# Patient Record
Sex: Male | Born: 1965
Health system: Southern US, Community
[De-identification: ages and names within clinical notes are randomized; demographics above are authoritative.]

## PROBLEM LIST (undated history)

## (undated) DIAGNOSIS — G600 Hereditary motor and sensory neuropathy: Secondary | ICD-10-CM

## (undated) DIAGNOSIS — J302 Other seasonal allergic rhinitis: Secondary | ICD-10-CM

## (undated) DIAGNOSIS — G4700 Insomnia, unspecified: Secondary | ICD-10-CM

## (undated) DIAGNOSIS — N529 Male erectile dysfunction, unspecified: Secondary | ICD-10-CM

## (undated) DIAGNOSIS — I10 Essential (primary) hypertension: Secondary | ICD-10-CM

## (undated) HISTORY — DX: Hereditary motor and sensory neuropathy: G60.0

## (undated) HISTORY — DX: Insomnia, unspecified: G47.00

## (undated) HISTORY — DX: Other seasonal allergic rhinitis: J30.2

## (undated) HISTORY — DX: Male erectile dysfunction, unspecified: N52.9

## (undated) HISTORY — PX: NO PAST SURGERIES: SHX2092

## (undated) HISTORY — DX: Essential (primary) hypertension: I10

---

## 1999-04-24 ENCOUNTER — Encounter: Admission: RE | Admit: 1999-04-24 | Discharge: 1999-04-24 | Payer: Self-pay | Admitting: Family Medicine

## 1999-06-08 ENCOUNTER — Encounter: Admission: RE | Admit: 1999-06-08 | Discharge: 1999-06-08 | Payer: Self-pay | Admitting: Family Medicine

## 1999-06-15 ENCOUNTER — Encounter: Admission: RE | Admit: 1999-06-15 | Discharge: 1999-06-15 | Payer: Self-pay | Admitting: Sports Medicine

## 1999-07-08 ENCOUNTER — Encounter: Admission: RE | Admit: 1999-07-08 | Discharge: 1999-07-08 | Payer: Self-pay | Admitting: Family Medicine

## 2000-07-14 ENCOUNTER — Encounter: Admission: RE | Admit: 2000-07-14 | Discharge: 2000-07-14 | Payer: Self-pay | Admitting: Family Medicine

## 2001-04-24 ENCOUNTER — Encounter: Admission: RE | Admit: 2001-04-24 | Discharge: 2001-04-24 | Payer: Self-pay | Admitting: Family Medicine

## 2001-11-15 ENCOUNTER — Encounter: Admission: RE | Admit: 2001-11-15 | Discharge: 2001-11-15 | Payer: Self-pay | Admitting: Sports Medicine

## 2002-05-16 ENCOUNTER — Encounter: Admission: RE | Admit: 2002-05-16 | Discharge: 2002-05-16 | Payer: Self-pay | Admitting: Family Medicine

## 2002-05-30 ENCOUNTER — Encounter: Admission: RE | Admit: 2002-05-30 | Discharge: 2002-05-30 | Payer: Self-pay | Admitting: Family Medicine

## 2002-10-24 ENCOUNTER — Encounter: Admission: RE | Admit: 2002-10-24 | Discharge: 2002-10-24 | Payer: Self-pay | Admitting: Family Medicine

## 2002-11-09 ENCOUNTER — Encounter: Admission: RE | Admit: 2002-11-09 | Discharge: 2002-11-09 | Payer: Self-pay | Admitting: Sports Medicine

## 2002-11-20 ENCOUNTER — Encounter: Admission: RE | Admit: 2002-11-20 | Discharge: 2002-12-04 | Payer: Self-pay

## 2003-02-07 ENCOUNTER — Encounter: Admission: RE | Admit: 2003-02-07 | Discharge: 2003-02-07 | Payer: Self-pay | Admitting: Family Medicine

## 2003-02-08 ENCOUNTER — Encounter: Admission: RE | Admit: 2003-02-08 | Discharge: 2003-02-08 | Payer: Self-pay | Admitting: Sports Medicine

## 2003-02-08 ENCOUNTER — Encounter: Payer: Self-pay | Admitting: Sports Medicine

## 2003-02-15 ENCOUNTER — Encounter: Admission: RE | Admit: 2003-02-15 | Discharge: 2003-02-15 | Payer: Self-pay | Admitting: Sports Medicine

## 2003-02-15 ENCOUNTER — Encounter: Payer: Self-pay | Admitting: Sports Medicine

## 2003-07-29 ENCOUNTER — Encounter: Admission: RE | Admit: 2003-07-29 | Discharge: 2003-07-29 | Payer: Self-pay | Admitting: Family Medicine

## 2004-03-23 ENCOUNTER — Ambulatory Visit: Payer: Self-pay | Admitting: Family Medicine

## 2004-06-05 ENCOUNTER — Ambulatory Visit: Payer: Self-pay | Admitting: Family Medicine

## 2004-09-09 ENCOUNTER — Ambulatory Visit: Payer: Self-pay | Admitting: Family Medicine

## 2005-04-12 ENCOUNTER — Ambulatory Visit: Payer: Self-pay | Admitting: Family Medicine

## 2006-07-07 DIAGNOSIS — G4700 Insomnia, unspecified: Secondary | ICD-10-CM | POA: Insufficient documentation

## 2006-07-07 DIAGNOSIS — J309 Allergic rhinitis, unspecified: Secondary | ICD-10-CM | POA: Insufficient documentation

## 2006-07-07 DIAGNOSIS — N529 Male erectile dysfunction, unspecified: Secondary | ICD-10-CM | POA: Insufficient documentation

## 2006-07-07 DIAGNOSIS — G609 Hereditary and idiopathic neuropathy, unspecified: Secondary | ICD-10-CM | POA: Insufficient documentation

## 2006-07-07 DIAGNOSIS — E669 Obesity, unspecified: Secondary | ICD-10-CM | POA: Insufficient documentation

## 2006-09-26 ENCOUNTER — Ambulatory Visit: Payer: Self-pay | Admitting: Sports Medicine

## 2006-09-26 DIAGNOSIS — Z720 Tobacco use: Secondary | ICD-10-CM | POA: Insufficient documentation

## 2006-12-04 ENCOUNTER — Encounter (INDEPENDENT_AMBULATORY_CARE_PROVIDER_SITE_OTHER): Payer: Self-pay | Admitting: Family Medicine

## 2007-01-02 ENCOUNTER — Encounter: Admission: RE | Admit: 2007-01-02 | Discharge: 2007-01-02 | Payer: Self-pay | Admitting: Sports Medicine

## 2007-01-02 ENCOUNTER — Encounter (INDEPENDENT_AMBULATORY_CARE_PROVIDER_SITE_OTHER): Payer: Self-pay | Admitting: Family Medicine

## 2007-01-02 ENCOUNTER — Ambulatory Visit: Payer: Self-pay | Admitting: Sports Medicine

## 2007-01-02 LAB — CONVERTED CEMR LAB
Cholesterol: 107 mg/dL (ref 0–200)
Glucose, Bld: 97 mg/dL (ref 70–99)
HDL: 45 mg/dL (ref >39)
LDL Cholesterol: 56 mg/dL (ref 0–99)
PSA: 0.46 ng/mL (ref 0.10–4.00)
Total CHOL/HDL Ratio: 2.4
Triglycerides: 32 mg/dL (ref <150)
VLDL: 6 mg/dL (ref 0–40)

## 2007-01-04 ENCOUNTER — Encounter (INDEPENDENT_AMBULATORY_CARE_PROVIDER_SITE_OTHER): Payer: Self-pay | Admitting: Family Medicine

## 2007-01-11 ENCOUNTER — Encounter: Admission: RE | Admit: 2007-01-11 | Discharge: 2007-04-11 | Payer: Self-pay | Admitting: Family Medicine

## 2007-01-17 ENCOUNTER — Telehealth (INDEPENDENT_AMBULATORY_CARE_PROVIDER_SITE_OTHER): Payer: Self-pay | Admitting: Family Medicine

## 2007-01-18 ENCOUNTER — Encounter (INDEPENDENT_AMBULATORY_CARE_PROVIDER_SITE_OTHER): Payer: Self-pay | Admitting: Family Medicine

## 2008-03-11 ENCOUNTER — Ambulatory Visit: Payer: Self-pay | Admitting: Family Medicine

## 2008-03-11 ENCOUNTER — Encounter (INDEPENDENT_AMBULATORY_CARE_PROVIDER_SITE_OTHER): Payer: Self-pay | Admitting: Family Medicine

## 2008-03-11 LAB — CONVERTED CEMR LAB
Glucose, Bld: 93 mg/dL (ref 70–99)
PSA: 0.75 ng/mL (ref 0.10–4.00)

## 2008-03-12 ENCOUNTER — Encounter (INDEPENDENT_AMBULATORY_CARE_PROVIDER_SITE_OTHER): Payer: Self-pay | Admitting: Family Medicine

## 2010-04-08 ENCOUNTER — Ambulatory Visit: Payer: Self-pay | Admitting: Family Medicine

## 2010-06-09 NOTE — Assessment & Plan Note (Signed)
Summary: cpe/el   Vital Signs:  Patient Profile:   45 Years Old Male Weight:      259 pounds Pulse rate:   69 / minute BP sitting:   129 / 78  Vitals Entered By: Lillia Pauls CMA (Sep 26, 2006 1:37 PM)               PCP:  Levander Campion MD  Chief Complaint:  cpe and refills.  History of Present Illness: Mr. Aaron Bond is a 45 year old AAM who presents for CPE.  We talked about the following:  1. Five-6 month h/o low back pain off and on, aggravated by lifting heavy objects at work, relieved by rest, but if rests too much, will get stiffness.  Pain is dull ache and radiates to right buttock.  No numbess, tingling, weakness, saddle anesthesia, bowel or bladder incontinence, or fevers. Has pain daily after work.  Takes 800mg  ibuprofen daily with some relief.  No h/o injury.  2. insomnia:  has trouble falling asleep and staying asleep. Alfonso Patten has helped in the past.  3. erectile dysfunction:  pt has trouble maintaining an erection secondary to charcot marie tooth.  Viagra has worked in the past for him.  4. seasonal allergies: symptoms have been better this year, not requiring meds.  5. Cigarette smoking: pt down to 2 cigarettes/day. smokes them to relax to try to get to sleep after work.       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 No apparent alopecia or balding. Ears:     R ear normal and L ear normal.   Nose:     no nasal discharge.   Mouth:     good dentition, pharynx pink and moist, no erythema,  and no exudates.   Neck:     No deformities, masses, or tenderness noted. Lungs:     Normal respiratory effort, chest expands symmetrically. Lungs are clear to auscultation, no crackles or wheezes. Heart:     Normal rate and regular rhythm. S1 and S2 normal without gallop, murmur, click, rub or other extra sounds. Abdomen:     Bowel sounds positive,abdomen soft and non-tender without masses, organomegaly or hernias noted. Rectal:     No external abnormalities noted. Normal sphincter tone. No rectal masses or tenderness. Genitalia:     Testes bilaterally descended without nodularity, tenderness or masses. No scrotal masses or lesions. No penis lesions or urethral discharge. Prostate:     Prostate gland firm and smooth, no enlargement, nodularity, tenderness, mass, asymmetry or induration. Msk:     No deformity or scoliosis noted of thoracic or lumbar spine.   Pulses:     R dorsalis pedis normal and L dorsalis pedis normal.   Extremities:     No clubbing, cyanosis, edema, or deformity noted with normal full range of motion of all joints.   Back: full rom, no deformity, nontender to palpation, straight leg negative.  See neuro exam Neurologic:     strength normal in all extremities, gait normal, and DTRs symmetrical and normal.  Impression & Recommendations:  Problem # 1:  LOW BACK PAIN, MILD (ICD-724.2) Assessment: New Most likely muscle strain vs bulging disk.  No need for imaging at this time given no neuro sx and no fever.  Treat with 800mg  ibuprofen three times a day x 2 weeks.  Perform low back pain stretches and resume work outs at gym, focusing on hamstrings and abs.  Return if no improvement. Orders: [facility] - Est  40-64 yrs ([id number])   Problem # 2:  INSOMNIA NOS (ICD-780.52) Assessment: Unchanged had been stable on lunesta in the past.  Will resume. Orders: [facility] - Est  40-64 yrs ([id number])   Problem # 3:  RHINITIS, ALLERGIC (ICD-477.9) Assessment: Improved Improved.  No  scripts currently.  Recommended zyrtec over the counter if worsens.  Problem # 4:  IMPOTENCE, ORGANIC (ICD-607.84) Assessment: Unchanged viagra refilled Orders: [facility] - Est  40-64 yrs ([id number])   Problem # 5:  Screening PSA (ICD-V76.44)  Problem # 6:  Preventive Health Care (ICD-V70.0) will check PSA, FLP, and fasting blood glucose.  Problem # 7:  SMOKER (ICD-305.1) Down to 2 cigarettes. Encouraged quitting.  Does not desire assistance at this time.  Hopefully being able to sleep will decrease desire for cigs to relax. Orders: [facility] - Est  40-64 yrs [phone])   Other Orders: Future Orders: Lipid-[facility] ([id number]) ... 09/08/2007 PSA-[facility] [phone]) ... 09/08/2007 Glucose-[facility] ([id number]) ... 09/08/2007   Patient Instructions: 1)  Please schedule a follow-up appointment in 1 year. 2)  Please return for lab work--fasting--to check your cholesterol, blood sugar,  and PSA--I will let you know the results. 3)  Work on quitting smoking!  You have come so far! 4)  Most patients (90%) with low back pain will improve with time (2-6 weeks). Keep active but avoid activities that are painful. Apply moist heat and/or ice to lower back several times a day. 5)  Take 800 mg ibuprofen three times daily for 2 weeks, then stop or decrease the dose.  Try stretches and exercise.  If back pain not better in 4-6 weeks, come back for an appointment.

## 2010-06-09 NOTE — Letter (Signed)
Summary: Results Follow-up Letter  [facility]  [address]   [place], [place] [address]   Phone: [phone]  Fax: [phone]    01/04/2007  [address] [place], [place]  [address]  Dear Mr. AINSLEY,   The following are the results of your recent test(s):   _________________________________________________________ Cholesterol LDL(Bad cholesterol):  56        Your goal is less than:  100       HDL (Good cholesterol):  45      Your goal is more than:39 _________________________________________________________ Other Tests:PSA (prostate cancer screening): 0.4 (normal). glucose: 97 (normal) triglycerides: 32 (goal: <150) x-ray of back: showed mild arthritis   _________________________________________________________  Excellent! Keep up the great work! _________________________________________________________ _________________________________________________________ _________________________________________________________  Sincerely,  Levander Campion MD [facility]           Appended Document: Results Follow-up Letter mailed letter to pt

## 2010-06-09 NOTE — Assessment & Plan Note (Signed)
Summary: yearly physical wp   Vital Signs:  Patient Profile:   45 Years Old Male Height:     188 inches Weight:      258.6 pounds Temp:     98.4 degrees F Pulse rate:   73 / minute BP sitting:   143 / 88  (left arm)  Vitals Entered By: Alphia Kava (March 11, 2008 8:30 AM)             Is Patient Diabetic? No     Serial Vital Signs/Assessments:  Time      Position  BP       Pulse  Resp  Temp     By 9:01 AM             138/88                         ADINA GOULD   PCP:  Levander Campion MD  Chief Complaint:  yearly physical exam, smoking, insomnia, and ED.  History of Present Illness: Mr. Klarich is here for his yearly physical.  We also discussed the following:  1.  Smoking: Mr. Hamblin smokes 1-2 cigarettes per day, always after work.  He reports they help him relax, and if he does not smoke them, he has increased difficulty falling asleep.  He has never tried any method to help him quit before, but he would very much like to quit.  In fact, he decided on a quit date of last Friday, and he has not had a cigarette since that night.  He reports he has been unable to relax at night and sleep has been worse than usual.  2. Insomnia: reports continuation of his chronic insomnia, for which he uses the cigarettes as above.  He has also tried [place] which has helped in the past, but he does not take this everyday and does not want to.  Reports he does drink caffeine in the AM, and may have one soda in the afternoon.  Has tried exercising after work, but this makes the problem worse.  Usually goes home after work, eats dinner, and watches TV, then goes to bed.  Denies symptoms of depression or anxiety.    3. erectile dysfunction: continues to have success with viagra. Denies side effects.    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 [facility], Elevated blood pressures, Erectile dysfuction  05/2002, L hydrocele 02/2003    insomnia  Past Surgical History:    Reviewed history from 07/07/2006 and no changes required:       R shoulder:  mild degen changes R AC joint - 02/20/2003, Scrotal u/s:  L hydrocele - 02/12/2003   Family History:    Reviewed history from 09/26/2006 and no changes required:       father-deceased 50y/o lung CA, mom-dm htn, hemodyalisis-passed away 3 yrs ago, sister and brother - HTN  Social History:    Reviewed history from 09/26/2006 and no changes required:       tobacco: 2 cigs/day/ no etoh, no drugs; married, two children:  [place] and [place]; Estate agent at [employer]; Exercises 4x/week (weights and bike, walk)   Risk Factors:     Counseled to quit/cut down tobacco use:  yes     Physical Exam  General:     Well-developed,well-nourished,in no acute distress; alert,appropriate and cooperative throughout examination Head:     Normocephalic and atraumatic without obvious abnormalities. No apparent alopecia or balding. Ears:  R ear normal and L ear normal.   Nose:     no nasal discharge.   Mouth:     good dentition, pharynx pink and moist, no erythema, and no exudates.   Neck:     No deformities, masses, or tenderness noted. Lungs:     Normal respiratory effort, chest expands symmetrically. Lungs are clear to auscultation, no crackles or wheezes. Heart:     Normal rate and regular rhythm. S1 and S2 normal without gallop, murmur, click, rub or other extra sounds. Abdomen:     Bowel sounds positive,abdomen soft and non-tender without masses, organomegaly or hernias noted. Rectal:     No external abnormalities noted. Normal sphincter tone. No rectal masses or tenderness. Prostate:     Prostate gland firm and smooth, no enlargement, nodularity, tenderness, mass, asymmetry or induration. Pulses:     R dorsalis pedis normal and L dorsalis pedis normal.   Extremities:     No clubbing, cyanosis, edema, or deformity noted with normal full range of motion of all joints.    Neurologic:     nonfocal Skin:      Intact without suspicious lesions or rashes    Impression & Recommendations:  Problem # 1:  Preventive Health Care (ICD-V70.0) Assessment: Comment Only with obesity and borderline fasting blood sugar last year of 97, will repeat fasting blood glucose.  Discussed pros and cons of prostate cancer screening, and patient would like to proceed.  Will obtain PSA.  DRE wnl.  FLP 1.5 years ago wnl, will not repeat as patient is without risk factors.  Consider repeat for obesity in 2 years, sooner if develops HTN or DM. continue diet and exercise routine for weight loss.  Problem # 2:  SMOKER (ICD-305.1) Assessment: Improved patient attempting to quit and would like assistance.  Discussed with Dr. Raymondo Band, and with behavioral aspect of 1-2 cigarettes to help sleep, will try nortriptilene.   Orders: [facility] - Est  40-64 yrs ([id number])   Problem # 3:  INSOMNIA NOS (ICD-780.52) Assessment: Unchanged uncertain etiology.  No symptoms of depression or anxiety.  Suggested eliminating all caffeine after noon, and establishing a bedtime routine.  Nortriptilene may also help. His updated medication list for this problem includes:    Lunesta 3 Mg Tabs (Eszopiclone) .Marland Kitchen... Take 1 tablet by mouth at bedtime  Orders: [facility] - Est  40-64 yrs ([id number])   Problem # 4:  IMPOTENCE, ORGANIC (ICD-607.84) Assessment: Unchanged continue viagra His updated medication list for this problem includes:    Viagra 50 Mg Tabs (Sildenafil citrate) .Marland Kitchen... Take 1 tablet by mouth as directed  Orders: [facility] - Est  40-64 yrs ([id number])   Problem # 5:  ELEVATED BLOOD PRESSURE WITHOUT DIAGNOSIS OF HYPERTENSION (ICD-796.2) Assessment: New blood pressure slightly elevated initally at 143/88.  Repeat was 138/88.  patient with family history of HTN.  Have patient check BP at work and bring these in at an RN Bp check in the next several weeks.  Discussed dietary changes and weight loss as well. Orders: [facility] - Est  40-64 yrs ([id number])   Complete  Medication List: 1)  Ibuprofen 800 Mg Tabs (Ibuprofen) .Marland Kitchen.. 1 by mouth tid 2)  Lunesta 3 Mg Tabs (Eszopiclone) .... Take 1 tablet by mouth at bedtime 3)  Rhinocort Aqua 32 Mcg/act Susp (Budesonide (nasal)) .... Spray 2 spray into both nostrils once a day 4)  Viagra 50 Mg Tabs (Sildenafil citrate) .... Take 1 tablet by mouth as directed 5)  Zyrtec Allergy  10 Mg Tabs (Cetirizine hcl) .... Take 1 tablet by mouth at bedtime  Other Orders: Glucose-[facility] ([id number]) PSA-[facility] ([id number])   Patient Instructions: 1)  Please schedule a follow-up appointment in 2-3 months. 2)  Start nortryptiline medication for smoking cessation and sleep.  Start 25 mg x one week, then increase to 50 mg x one week, then 75 mg nightly. 3)  Have your blood pressure taken at work and write down the numbers--make an appointment today for a BP check with the nurse here in 2-3 weeks. 4)  Keep up your diet and exercise routine--losing just 5% of your body weight can reduce your BP and blood sugar. 5)  I will let you know your test results from today.   ]

## 2010-06-09 NOTE — Assessment & Plan Note (Signed)
Summary: physical/bmc   Vital Signs:  Patient profile:   45 year old male Height:      188 inches Weight:      246 pounds BMI:     4.91 Temp:     97.9 degrees F oral Pulse rate:   75 / minute BP sitting:   123 / 81  (right arm) Cuff size:   large  Vitals Entered By: Tessie Fass CMA (April 08, 2010 9:10 AM) CC: CPE, allergies, insomnia Is Patient Diabetic? No Pain Assessment Patient in pain? no        Primary Care Jaleena Viviani:  Levander Campion MD  CC:  CPE, allergies, and insomnia.  History of Present Illness: Here for CPE,  Not taking any medications   Smoking-- smoke 1-2 cig a day, has difficulty sleeping therefore smokes, has been a habit for approx 10 years, does not want to quit to yet, does not want to use nicotine replacement    Allergies- feels he has seasonal allergies, with itchy irrated eyes , sneezng and occ runny nose, no SOB, does not use any OTC meds  Insomnia- history of insomnia for many years, use to use ETOD to help him sleep, now has habt of smoking 1-2 cig after work, works second shift, drinks 1 24 ounces of soda every AM, denies daytime sleepiness, does not nap, does not watch TV at bedtime, typically sleeps 7-8 hours, had difficlty falling asleep. Currenty using tylenol pm 1-2 times per week   Habits & Providers  Alcohol-Tobacco-Diet     Tobacco Status: current     Tobacco Counseling: to quit use of tobacco products     Cigarette Packs/Day: <0.25      Current Medications (verified): 1)  Zyrtec Allergy 10 Mg Tabs (Cetirizine Hcl) .Marland Kitchen.. 1 By Mouth Daily As Needed Allergies 2)  Ambien 10 Mg Tabs (Zolpidem Tartrate) .Marland Kitchen.. 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         no joint pain, no difficulty starting or stopping urine stream, denes sexual dysfunction  Physical Exam  General:  Well-developed,well-nourished,in no acute distress; alert,appropriate and cooperative throughout examination Vital signs noted  Eyes:  mild injection of eyes bilat, no drainage noted PERRL, EOMI Ears:  TM clear bilat, canals clear Nose:  minimal clear discharge Mouth:  mmm, fair denttion Neck:  supple Lungs:  CTAB, normal WOB Heart:  RRR Abdomen:  soft, non-tender, and no distention.   Msk:  FROM x 4 ext Pulses:  radial pulse 2+ Extremities:  no edema Neurologic:  no focal deficits Psych:  pleaseant, oriented x 3, memory intact, no depressed appearing   Impression & Recommendations:  Problem # 1:  HEALTH MAINTENANCE EXAM (ICD-V70.0) Assessment New  Discussed tobacco dependencce, continued exercisse routine. Pt has had labs done last year inclduing lipids he is send a copy in, will not repeat today Flu shot given at work  Orders: [facility] - Est  40-64 yrs ([id number])  Problem # 2:  RHINITIS, ALLERGIC (  ICD-477.9) Assessment: New  Pt willing to try anti-histamine as needed, does not have severe symptoms at this time The following medications were removed from the medication list:    Rhinocort Aqua 32 Mcg/act Susp (Budesonide (nasal)) ..... Spray 2 spray into both nostrils once a day    Zyrtec Allergy 10 Mg Tabs (Cetirizine hcl) .Marland Kitchen... Take 1 tablet by mouth at bedtime His updated medication list for this problem includes:    Zyrtec Allergy 10 Mg Tabs (Cetirizine hcl) .Marland Kitchen... 1 by mouth daily as needed allergies  Orders: [facility] - Est  40-64  yrs ([id number])  Problem # 3:  INSOMNIA NOS (ICD-780.52) Assessment: Unchanged  long standing problem for pt, does not meet critera for OSA, previous habit of ETOH to help in sleep for many years, past 10 years alcohol free but has habit of smoking. Give trial of ambien, pt has fairly good sleep hygiene with regards to no disturbances, caffiene intake and routine The following medications were removed from the medication list:    Lunesta 3 Mg Tabs (Eszopiclone) .Marland Kitchen... Take 1 tablet by mouth at bedtime His updated medication list for this problem includes:    Ambien 10 Mg Tabs (Zolpidem tartrate) .Marland Kitchen... 1 by mouth at bedtime as needed insomnia  Orders: [facility] - Est  40-64 yrs ([id number])  Problem # 4:  SMOKER (ICD-305.1) Assessment: Unchanged  couseled to quit  Orders: [facility] - Est  40-64 yrs ([id number])  Complete Medication List: 1)  Zyrtec Allergy 10 Mg Tabs (Cetirizine hcl) .Marland Kitchen.. 1 by mouth daily as needed allergies 2)  Ambien 10 Mg Tabs (Zolpidem tartrate) .Marland Kitchen.. 1 by mouth at bedtime as needed insomnia  Patient Instructions: 1)  Send a copy of your labs from work 2)  Take the Zyrtec as needed daily for allergies 3)  I will send a referral for the surgeron  4)  Start the [place] as needed for sleep - 1 tablet at night 5)  Next visit in 3 months to follow-up your sleep  Prescriptions: AMBIEN 10 MG TABS (ZOLPIDEM TARTRATE) 1 by mouth at bedtime as needed insomnia  #30 x 2   Entered and Authorized by:   Milinda Antis MD   Signed by:   Milinda Antis MD on 04/08/2010   Method used:   Handwritten   RxID:   [id number] ZYRTEC ALLERGY 10 MG TABS (CETIRIZINE HCL) 1 by mouth daily as needed allergies  #30 x 6   Entered and Authorized by:   Milinda Antis MD   Signed by:   Milinda Antis MD on 04/08/2010   Method used:   Electronically to        [facility]  [address] [id number] (retail)       [address]       [place], [place]  [address]       Ph: [phone] or [phone]       Fax:  [phone]   RxID:   [id number]    Orders Added: 1)  [facility] - Est  40-64 yrs [[id number]]     Prevention & Chronic Care Immunizations   Influenza vaccine: Given Oct 2011-pt work place  (04/08/2010)   Influenza vaccine due: 01/09/2011    Tetanus booster: 04/10/1999: Done.   Tetanus booster due: 04/09/2009    Pneumococcal vaccine: Not documented   Pneumococcal vaccine due: 02/26/2031  Other Screening   Smoking status: current  (04/08/2010)   Smoking cessation counseling: yes  (03/11/2008)  Lipids   Total Cholesterol: 107  (01/02/2007)   LDL: 56  (  01/02/2007)   LDL Direct: Not documented   HDL: 45  (01/02/2007)   Triglycerides: 32  (01/02/2007)

## 2010-06-09 NOTE — Letter (Signed)
Summary: Out of Work  All     ,     Phone:   Fax:     January 18, 2007   Employee:  Carole Civil    To Whom It May Concern:   For Medical reasons, please excuse the above named employee from work for the following dates:  Start:   01/02/07  End:   01/31/07 Patient may return to full duty without restrictions at this time.  If you need additional information, please feel free to contact our office.         Sincerely,    Reha Martinovich Luz Brazen MD

## 2010-06-09 NOTE — Letter (Signed)
Summary: Generic Letter  [facility]  [address]   [place], [place] [address]   Phone: [phone]  Fax: [phone]    03/12/2008  Aaron Bond [address] [place], [place]  [address]  Dear Mr. Aaron Bond,   Your fasting blood sugar and PSA (screening test for prostate cancer) were normal yesterday.  Your blood sugar was 93, which is slightly improved from last year.  Continue to exercise and work on losing weight. I look forward to seeing you at your next appointment.        Sincerely,   Levander Campion MD [facility]  Appended Document: Generic Letter sent

## 2010-06-09 NOTE — Assessment & Plan Note (Signed)
Summary: fu back pain still persistant wp   Vital Signs:  Patient Profile:   45 Years Old Male Weight:      260 pounds Pulse rate:   70 / minute BP sitting:   121 / 75  (right arm)  Pt. in pain?   yes  Vitals Entered By: Arlyss Repress CMA, (January 02, 2007 8:43 AM)              Is Patient Diabetic? No   PCP:  Aaron Campion MD  Chief Complaint:  f/up low back pain 5/10.  History of Present Illness:  Aaron Bond was seen recently for  5-6 month h/o low back pain off and on, aggravated by lifting heavy objects at work, relieved by rest, but if rests too much, will get stiffness.  Pain is dull ache and radiates to right buttock.  No numbess, tingling, weakness, saddle anesthesia, bowel or bladder incontinence, or fevers. Pain is unchanged and getting worse.  Tried 14 days of ibuprofen 800 mg three times a day with no improvement.  Tried exercises onhandout without improvement.  Gets aggrevated every night at work.       Risk Factors:  Tobacco use:  current    Physical Exam  General:     Well-developed,well-nourished,in no acute distress; alert,appropriate and cooperative throughout examination Msk:     Back mildly TTP right lumbar region over right buttock.  normal ROM.  straight leg negative. Neurologic:     No cranial nerve deficits noted. Station and gait are normal. Plantar reflexes are down-going bilaterally. DTRs are symmetrical throughout. Sensory, motor and coordinative functions appear intact.    Impression & Recommendations:  Problem # 1:  LOW BACK PAIN, MILD (ICD-724.2) Consistent with low back strain.  Failed ibuprofen and exercises at home.  No red flags.  Change to voltaren.  refer to physical therapy.  Obtain x-ray. His updated medication list for this problem includes:    Ibuprofen 800 Mg Tabs (Ibuprofen) .Marland Kitchen... 1 by mouth tid    Voltaren-xr 100 Mg Tb24 (Diclofenac sodium) .Marland Kitchen... 1 by mouth daily x 2 weeks, then as needed for back  pain  Orders: Physical Therapy Referral (PT) Diagnostic X-Ray/Fluoroscopy (Diagnostic X-Ray/Flu) [facility]- Est Level  3 (16109)   Complete Medication List: 1)  Ibuprofen 800 Mg Tabs (Ibuprofen) .Marland Kitchen.. 1 by mouth tid 2)  Lunesta 3 Mg Tabs (Eszopiclone) .... Take 1 tablet by mouth at bedtime 3)  Rhinocort Aqua 32 Mcg/act Susp (Budesonide (nasal)) .... Spray 2 spray into both nostrils once a day 4)  Viagra 50 Mg Tabs (Sildenafil citrate) .... Take 1 tablet by mouth as directed 5)  Zyrtec Allergy 10 Mg Tabs (Cetirizine hcl) .... Take 1 tablet by mouth at bedtime 6)  Voltaren-xr 100 Mg Tb24 (Diclofenac sodium) .Marland Kitchen.. 1 by mouth daily x 2 weeks, then as needed for back pain  Other Orders: Glucose-[facility] ([id number]) Lipid-[facility] ([id number]) PSA-[facility] ([id number])   Patient Instructions: 1)  Please schedule a follow-up appointment in 1 month for low back pain. 2)  Have your x-rays taken today, we will let you know the results. 3)  We will make a referral to physical therapy; in the meantime, continue exercises from before. 4)  Take 2 weeks off work. 5)  Have your labs drawn today--we will let you know the results. 6)  Try new medication--diclofenac--once daily x 14 days, then as needed.  Do not take ibuprofen with this medication.    Prescriptions: VOLTAREN-XR 100 MG  TB24 (DICLOFENAC SODIUM) 1  by mouth daily x 2 weeks, then as needed for back pain  #31 x 3   Entered and Authorized by:   Aaron Campion MD   Signed by:   Aaron Campion MD on 01/02/2007   Method used:   Print then Give to Patient   RxID:   [id number] VOLTAREN-XR 100 MG  TB24 (DICLOFENAC SODIUM) 1 by mouth daily  #14 x 2   Entered and Authorized by:   Aaron Campion MD   Signed by:   Aaron Campion MD on 01/02/2007   Method used:   Print then Give to Patient   RxID:   [id number]

## 2010-06-09 NOTE — Assessment & Plan Note (Signed)
  Medications Added IBUPROFEN 800 MG TABS (IBUPROFEN) 1 by mouth tid LUNESTA 3 MG TABS (ESZOPICLONE) Take 1 tablet by mouth at bedtime RHINOCORT AQUA 32 MCG/ACT SUSP (BUDESONIDE (NASAL)) Spray 2 spray into both nostrils once a day VIAGRA 50 MG TABS (SILDENAFIL CITRATE) Take 1 tablet by mouth as directed ZYRTEC ALLERGY 10 MG TABS (CETIRIZINE HCL) Take 1 tablet by mouth at bedtime                 Medications Added to Medication List This Visit: 1)  Ibuprofen 800 Mg Tabs (Ibuprofen) .Marland Kitchen.. 1 by mouth tid 2)  Lunesta 3 Mg Tabs (Eszopiclone) .... Take 1 tablet by mouth at bedtime 3)  Rhinocort Aqua 32 Mcg/act Susp (Budesonide (nasal)) .... Spray 2 spray into both nostrils once a day 4)  Viagra 50 Mg Tabs (Sildenafil citrate) .... Take 1 tablet by mouth as directed 5)  Zyrtec Allergy 10 Mg Tabs (Cetirizine hcl) .... Take 1 tablet by mouth at bedtime     Prescriptions: IBUPROFEN 800 MG TABS (IBUPROFEN) 1 by mouth tid  #42 x 2   Entered and Authorized by:   Levander Campion MD   Signed by:   Levander Campion MD on 12/04/2006   Method used:   Print then Give to Patient   RxID:   [id number]

## 2010-06-09 NOTE — Progress Notes (Signed)
Summary: question re: return date to work  Phone Note Call from Patient Call back at [phone]   Reason for Call: Talk to Nurse Summary of Call: pt sts he just had some insurance papers filled out but his work is requring a letter stating when he can go back on full duty and sts his return date should have been today, would like to speak with rn about this. Initial call taken by: ERIN LEVAN,  January 17, 2007 3:12 PM  Follow-up for Phone Call        I filled out all those papers last week about why he needed the time off, etc.  I am happy to write a letter stating it is ok for him to go back to work...does he want me to restrict him to light lifting? Follow-up by: Levander Campion MD,  January 18, 2007 8:50 AM  Additional Follow-up for Phone Call Additional follow up Details #1::        Phone call to pt- his wife states he is at physical therapy, and he will return my call.  Additional Follow-up by: AMY MARTIN RN,  January 18, 2007 11:44 AM    Additional Follow-up for Phone Call Additional follow up Details #2::    Paperwork revised and new note written for patient to return to work 01/31/07 to full duty. Follow-up by: Levander Campion MD,  January 18, 2007 1:44 PM

## 2010-06-10 ENCOUNTER — Encounter: Payer: Self-pay | Admitting: *Deleted

## 2010-06-19 ENCOUNTER — Other Ambulatory Visit: Payer: Self-pay | Admitting: Family Medicine

## 2010-06-19 MED ORDER — ZOLPIDEM TARTRATE 10 MG PO TABS: 10.0000 mg | ORAL_TABLET | Freq: Every evening | ORAL | Status: DC | PRN

## 2010-10-02 ENCOUNTER — Other Ambulatory Visit: Payer: Self-pay | Admitting: Family Medicine

## 2010-10-02 MED ORDER — ZOLPIDEM TARTRATE 10 MG PO TABS: 10.0000 mg | ORAL_TABLET | Freq: Every evening | ORAL | Status: DC | PRN

## 2010-10-02 NOTE — Telephone Encounter (Signed)
Sent via fax form Pt needs an office visit before any future fills

## 2010-10-29 ENCOUNTER — Other Ambulatory Visit: Payer: Self-pay | Admitting: Family Medicine

## 2010-10-29 MED ORDER — ZOLPIDEM TARTRATE 10 MG PO TABS: 10.0000 mg | ORAL_TABLET | Freq: Every evening | ORAL | Status: DC | PRN

## 2010-12-20 ENCOUNTER — Other Ambulatory Visit: Payer: Self-pay | Admitting: Family Medicine

## 2010-12-20 MED ORDER — ZOLPIDEM TARTRATE 10 MG PO TABS: 10.0000 mg | ORAL_TABLET | Freq: Every evening | ORAL | Status: DC | PRN

## 2011-02-03 ENCOUNTER — Other Ambulatory Visit: Payer: Self-pay | Admitting: Family Medicine

## 2011-02-03 MED ORDER — ZOLPIDEM TARTRATE 10 MG PO TABS: 10.0000 mg | ORAL_TABLET | Freq: Every evening | ORAL | Status: DC | PRN

## 2011-02-03 NOTE — Progress Notes (Signed)
Rx for [place] faxed in.  Needs appt. Prior to receiving next refill

## 2011-03-19 ENCOUNTER — Telehealth: Payer: Self-pay | Admitting: Family Medicine

## 2011-03-19 ENCOUNTER — Other Ambulatory Visit: Payer: Self-pay | Admitting: Family Medicine

## 2011-03-19 MED ORDER — ZOLPIDEM TARTRATE 10 MG PO TABS: 10.0000 mg | ORAL_TABLET | Freq: Every evening | ORAL | Status: DC | PRN

## 2011-03-19 NOTE — Telephone Encounter (Signed)
Fwd. To PCP .Marcia Hartwell  

## 2011-03-19 NOTE — Telephone Encounter (Signed)
Pharmacy is calling for a refill on the Ambien.  The refill request faxed yesterday and then again today.  [facility] [place].

## 2011-03-19 NOTE — Telephone Encounter (Signed)
Completed and faxed back, will refill x 1 but needs appt. Prior to next refill

## 2011-04-19 ENCOUNTER — Encounter: Payer: Self-pay | Admitting: Family Medicine

## 2011-04-19 ENCOUNTER — Ambulatory Visit (INDEPENDENT_AMBULATORY_CARE_PROVIDER_SITE_OTHER): Payer: BC Managed Care – PPO | Admitting: Family Medicine

## 2011-04-19 DIAGNOSIS — E669 Obesity, unspecified: Secondary | ICD-10-CM

## 2011-04-19 DIAGNOSIS — G4700 Insomnia, unspecified: Secondary | ICD-10-CM

## 2011-04-19 DIAGNOSIS — Z0000 Encounter for general adult medical examination without abnormal findings: Secondary | ICD-10-CM

## 2011-04-19 LAB — COMPREHENSIVE METABOLIC PANEL
ALT: 30 U/L (ref 0–53)
AST: 26 U/L (ref 0–37)
Albumin: 4.2 g/dL (ref 3.5–5.2)
Alkaline Phosphatase: 40 U/L (ref 39–117)
BUN: 15 mg/dL (ref 6–23)
CO2: 25 mEq/L (ref 19–32)
Calcium: 9 mg/dL (ref 8.4–10.5)
Chloride: 108 mEq/L (ref 96–112)
Creat: 0.84 mg/dL (ref 0.50–1.35)
Glucose, Bld: 94 mg/dL (ref 70–99)
Potassium: 4.1 mEq/L (ref 3.5–5.3)
Sodium: 142 mEq/L (ref 135–145)
Total Bilirubin: 0.5 mg/dL (ref 0.3–1.2)
Total Protein: 6.4 g/dL (ref 6.0–8.3)

## 2011-04-19 LAB — LIPID PANEL
Cholesterol: 102 mg/dL (ref 0–200)
HDL: 46 mg/dL (ref >39)
LDL Cholesterol: 49 mg/dL (ref 0–99)
Total CHOL/HDL Ratio: 2.2 Ratio
Triglycerides: 33 mg/dL (ref <150)
VLDL: 7 mg/dL (ref 0–40)

## 2011-04-19 MED ORDER — ZOLPIDEM TARTRATE 10 MG PO TABS: 10.0000 mg | ORAL_TABLET | Freq: Every evening | ORAL | Status: DC | PRN

## 2011-04-21 ENCOUNTER — Encounter: Payer: Self-pay | Admitting: Family Medicine

## 2011-04-21 NOTE — Progress Notes (Signed)
SUBJECTIVE:  Aaron Bond is a 45 y.o. male presenting for his annual checkup. Current Outpatient Prescriptions  Medication Sig Dispense Refill  . cetirizine (ZYRTEC) 10 MG tablet Take 10 mg by mouth daily as needed. for allergies       . zolpidem (AMBIEN) 10 MG tablet Take 1 tablet (10 mg total) by mouth at bedtime as needed for sleep.  30 tablet  5   Allergies: Review of patient's allergies indicates not on file.   Current smoker- 2-3 cigarettes per day, wants to stop eventually, not ready yet.  Received flu vaccine at work  ROS:  Feeling well. No dyspnea or chest pain on exertion. No abdominal pain, change in bowel habits, black or bloody stools. No urinary tract or prostatic symptoms. No neurological complaints.  OBJECTIVE:  The patient appears well, alert, oriented x 3, in no distress.  BP 120/80  Pulse 65  Ht 6\' 2"  (1.88 m)  Wt 258 lb (117.028 kg)  BMI 33.13 kg/m2 ENT normal.  Neck supple. No adenopathy or thyromegaly. PERLA.  Lungs are clear, good air entry, no wheezes, rhonchi or rales.  Heart with S1 and S2 normal, no murmurs, regular rate and rhythm.  Abdomen is soft without tenderness, guarding, mass or organomegaly.  GU exam: rectum normal, no masses, prostate normal size, symmetric, no nodules or tenderness.  Extremities show no edema, normal peripheral pulses.  Neurological is normal without focal findings.  ASSESSMENT:  healthy adult male  PLAN:  quit smoking, follow a low fat, low cholesterol diet, continue current medications, continue current healthy lifestyle patterns and return for routine annual checkups Check lipids and CMET, overweight

## 2011-04-26 ENCOUNTER — Encounter: Payer: Self-pay | Admitting: Family Medicine

## 2011-11-19 ENCOUNTER — Other Ambulatory Visit: Payer: Self-pay | Admitting: Family Medicine

## 2011-11-19 DIAGNOSIS — G4700 Insomnia, unspecified: Secondary | ICD-10-CM

## 2011-11-19 MED ORDER — ZOLPIDEM TARTRATE 10 MG PO TABS: 10.0000 mg | ORAL_TABLET | Freq: Every evening | ORAL | Status: DC | PRN

## 2011-11-19 NOTE — Telephone Encounter (Signed)
[facility]  This medication is not meant to be taken every night.  He has risk factors for OSA so I recommend an office visit with Dr Ashley Royalty before any further refills.

## 2011-12-23 ENCOUNTER — Encounter: Payer: Self-pay | Admitting: Family Medicine

## 2011-12-23 ENCOUNTER — Ambulatory Visit (INDEPENDENT_AMBULATORY_CARE_PROVIDER_SITE_OTHER): Payer: BC Managed Care – PPO | Admitting: Family Medicine

## 2011-12-23 VITALS — BP 137/85 | HR 65 | Ht 74.0 in | Wt 247.8 lb

## 2011-12-23 DIAGNOSIS — M545 Low back pain, unspecified: Secondary | ICD-10-CM

## 2011-12-23 DIAGNOSIS — G4700 Insomnia, unspecified: Secondary | ICD-10-CM

## 2011-12-23 MED ORDER — ZOLPIDEM TARTRATE 10 MG PO TABS: 10.0000 mg | ORAL_TABLET | Freq: Every evening | ORAL | Status: DC | PRN

## 2011-12-23 MED ORDER — MELOXICAM 15 MG PO TABS: 15.0000 mg | ORAL_TABLET | Freq: Every day | ORAL | Status: AC

## 2011-12-23 NOTE — Patient Instructions (Addendum)
Thank you for coming in today, it was good to see you Try the meloxicam for your back pain. Do not take ibuprofen or aleve with this.   Also I would like for you to go to have and xray done of your lower back, you can have this done at the hospital or a [facility] on [address].

## 2011-12-26 ENCOUNTER — Encounter: Payer: Self-pay | Admitting: Family Medicine

## 2011-12-26 NOTE — Assessment & Plan Note (Signed)
Refilled ambien

## 2011-12-26 NOTE — Assessment & Plan Note (Signed)
Likely MSK pain given relief with ibuprofen/aleve.  Does have some associated numbness/tingling and mild foot drop but I think that this is probably more related to his history of CMT.  Will go ahead and order plain films of L-spine to evaluate disk spaces.

## 2011-12-26 NOTE — Assessment & Plan Note (Signed)
>>  ASSESSMENT AND PLAN FOR INSOMNIA NOS WRITTEN ON 12/26/2011  9:58 PM BY MATTHEWS, CODY  Refilled ambien.

## 2011-12-26 NOTE — Progress Notes (Signed)
  Subjective:    Patient ID: Aaron Bond, male    DOB: 1965/12/27, 46 y.o.   MRN: [id number]  HPI  1. Low back pain:  Hx of LBP x >6 months.  Feels like pain has not improved much over the past few months.  Does endorse mild numbness and tingling into leg. 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  He denies bowel or bladder dysfunction, blood in stool, dyuria.  His pain is completely relieved with advil or aleve.  2.  Insomnia:  Hx of insomnia, currently uses ambien.  Works well for him.  Without medication has significant difficulty falling asleep.  Once he is asleep he has no trouble staying asleep.  Feels refreshed when waking in the morning.    Review of Systems Per HPI    Objective:   Physical Exam  Constitutional: He appears well-nourished. No distress.  HENT:  Head: Normocephalic and atraumatic.  Cardiovascular: Normal rate and regular rhythm.   Abdominal: Soft. He exhibits no distension. There is no tenderness.  Musculoskeletal:       Back Exam: Inspection: Normal to inspection and palpation Motion: FROM SLR seated:  Neg                        SLR lying:Neg FABER:  No pain  Reflex change: 1+  Gait: Normal    Neurological: He is alert.          Assessment & Plan:

## 2012-06-26 ENCOUNTER — Other Ambulatory Visit: Payer: Self-pay | Admitting: Family Medicine

## 2012-06-26 DIAGNOSIS — G4700 Insomnia, unspecified: Secondary | ICD-10-CM

## 2012-06-26 MED ORDER — ZOLPIDEM TARTRATE 10 MG PO TABS: 10.0000 mg | ORAL_TABLET | Freq: Every evening | ORAL | Status: DC | PRN

## 2013-01-09 ENCOUNTER — Other Ambulatory Visit: Payer: Self-pay | Admitting: *Deleted

## 2013-01-09 DIAGNOSIS — G4700 Insomnia, unspecified: Secondary | ICD-10-CM

## 2013-01-09 MED ORDER — ZOLPIDEM TARTRATE 10 MG PO TABS: 10.0000 mg | ORAL_TABLET | Freq: Every evening | ORAL | Status: DC | PRN

## 2013-01-10 MED ORDER — ZOLPIDEM TARTRATE 10 MG PO TABS: 10.0000 mg | ORAL_TABLET | Freq: Every evening | ORAL | Status: DC | PRN

## 2013-01-10 NOTE — Telephone Encounter (Signed)
Rx printed and left at front desk. Please call pt to let them know to pick it up. Thanks! --CMS 

## 2013-01-10 NOTE — Telephone Encounter (Signed)
All numbers tried and none are in service Wyatt Haste, RN-BSN

## 2013-12-24 ENCOUNTER — Encounter (HOSPITAL_COMMUNITY): Payer: Self-pay | Admitting: Emergency Medicine

## 2013-12-24 ENCOUNTER — Emergency Department (HOSPITAL_COMMUNITY)
Admission: EM | Admit: 2013-12-24 | Discharge: 2013-12-24 | Disposition: A | Discharge status: Home / Self Care | Payer: BC Managed Care – PPO | Attending: Emergency Medicine | Admitting: Emergency Medicine

## 2013-12-24 DIAGNOSIS — T63461A Toxic effect of venom of wasps, accidental (unintentional), initial encounter: Secondary | ICD-10-CM | POA: Diagnosis not present

## 2013-12-24 DIAGNOSIS — F17200 Nicotine dependence, unspecified, uncomplicated: Secondary | ICD-10-CM | POA: Diagnosis not present

## 2013-12-24 DIAGNOSIS — Z87448 Personal history of other diseases of urinary system: Secondary | ICD-10-CM | POA: Diagnosis not present

## 2013-12-24 DIAGNOSIS — L03119 Cellulitis of unspecified part of limb: Secondary | ICD-10-CM | POA: Insufficient documentation

## 2013-12-24 DIAGNOSIS — Z8669 Personal history of other diseases of the nervous system and sense organs: Secondary | ICD-10-CM | POA: Diagnosis not present

## 2013-12-24 DIAGNOSIS — Y93H2 Activity, gardening and landscaping: Secondary | ICD-10-CM | POA: Insufficient documentation

## 2013-12-24 DIAGNOSIS — L03115 Cellulitis of right lower limb: Secondary | ICD-10-CM

## 2013-12-24 DIAGNOSIS — Y9289 Other specified places as the place of occurrence of the external cause: Secondary | ICD-10-CM | POA: Diagnosis not present

## 2013-12-24 DIAGNOSIS — L02419 Cutaneous abscess of limb, unspecified: Secondary | ICD-10-CM | POA: Insufficient documentation

## 2013-12-24 DIAGNOSIS — T6391XA Toxic effect of contact with unspecified venomous animal, accidental (unintentional), initial encounter: Secondary | ICD-10-CM | POA: Insufficient documentation

## 2013-12-24 DIAGNOSIS — T63441A Toxic effect of venom of bees, accidental (unintentional), initial encounter: Secondary | ICD-10-CM

## 2013-12-24 MED ORDER — IBUPROFEN 400 MG PO TABS
800.0000 mg | ORAL_TABLET | Freq: Once | ORAL | Status: AC
  Administered 2013-12-24: 800 mg via ORAL
  Filled 2013-12-24: qty 2

## 2013-12-24 MED ORDER — SULFAMETHOXAZOLE-TMP DS 800-160 MG PO TABS: 1.0000 | ORAL_TABLET | Freq: Two times a day (BID) | ORAL | Status: DC

## 2013-12-24 MED ORDER — LORATADINE 10 MG PO TABS: 10.0000 mg | ORAL_TABLET | Freq: Every day | ORAL | Status: DC

## 2013-12-24 MED ORDER — DIPHENHYDRAMINE HCL 25 MG PO CAPS
25.0000 mg | ORAL_CAPSULE | Freq: Once | ORAL | Status: AC
  Administered 2013-12-24: 25 mg via ORAL
  Filled 2013-12-24: qty 1

## 2013-12-24 MED ORDER — DIPHENHYDRAMINE HCL 25 MG PO TABS: 25.0000 mg | ORAL_TABLET | Freq: Four times a day (QID) | ORAL | Status: DC

## 2013-12-24 NOTE — Discharge Instructions (Signed)
Please follow up with your primary care physician in 1-2 days. If you do not have one please call the [facility] number listed above. Please take your antibiotic until completion. Please read all discharge instructions and return precautions.  Bee, Wasp, or Hornet Sting Your caregiver has diagnosed you as having an insect sting. An insect sting appears as a red lump in the skin that sometimes has a tiny hole in the center, or it may have a stinger in the center of the wound. The most common stings are from wasps, hornets and bees. Individuals have different reactions to insect stings.  A normal reaction may cause pain, swelling, and redness around the sting site.  A localized allergic reaction may cause swelling and redness that extends beyond the sting site.  A large local reaction may continue to develop over the next 12 to 36 hours.  On occasion, the reactions can be severe (anaphylactic reaction). An anaphylactic reaction may cause wheezing; difficulty breathing; chest pain; fainting; raised, itchy, red patches on the skin; a sick feeling to your stomach (nausea); vomiting; cramping; or diarrhea. If you have had an anaphylactic reaction to an insect sting in the past, you are more likely to have one again. HOME CARE INSTRUCTIONS   With bee stings, a small sac of poison is left in the wound. Brushing across this with something such as a credit card, or anything similar, will help remove this and decrease the amount of the reaction. This same procedure will not help a wasp sting as they do not leave behind a stinger and poison sac.  Apply a cold compress for 10 to 20 minutes every hour for 1 to 2 days, depending on severity, to reduce swelling and itching.  To lessen pain, a paste made of water and baking soda may be rubbed on the bite or sting and left on for 5 minutes.  To relieve itching and swelling, you may use take medication or apply medicated creams or lotions as  directed.  Only take over-the-counter or prescription medicines for pain, discomfort, or fever as directed by your caregiver.  Wash the sting site daily with soap and water. Apply antibiotic ointment on the sting site as directed.  If you suffered a severe reaction:  If you did not require hospitalization, an adult will need to stay with you for 24 hours in case the symptoms return.  You may need to wear a medical bracelet or necklace stating the allergy.  You and your family need to learn when and how to use an anaphylaxis kit or epinephrine injection.  If you have had a severe reaction before, always carry your anaphylaxis kit with you. SEEK MEDICAL CARE IF:   None of the above helps within 2 to 3 days.  The area becomes red, warm, tender, and swollen beyond the area of the bite or sting.  You have an oral temperature above 102 F (38.9 C). SEEK IMMEDIATE MEDICAL CARE IF:  You have symptoms of an allergic reaction which are:  Wheezing.  Difficulty breathing.  Chest pain.  Lightheadedness or fainting.  Itchy, raised, red patches on the skin.  Nausea, vomiting, cramping or diarrhea. ANY OF THESE SYMPTOMS MAY REPRESENT A SERIOUS PROBLEM THAT IS AN EMERGENCY. Do not wait to see if the symptoms will go away. Get medical help right away. Call your local emergency services (911 in [place] DO NOT drive yourself to the hospital. MAKE SURE YOU:   Understand these instructions.  Will watch your condition.  Will get help right away if you are not doing well or get worse. Document Released: 04/26/2005 Document Revised: 07/19/2011 Document Reviewed: 10/11/2009 [facility] Patient Information 2015 [place], [place]. This information is not intended to replace advice given to you by your health care provider. Make sure you discuss any questions you have with your health care provider. Cellulitis Cellulitis is an infection of the skin and the tissue beneath it. The infected area is usually  red and tender. Cellulitis occurs most often in the arms and lower legs.  CAUSES  Cellulitis is caused by bacteria that enter the skin through cracks or cuts in the skin. The most common types of bacteria that cause cellulitis are staphylococci and streptococci. SIGNS AND SYMPTOMS   Redness and warmth.  Swelling.  Tenderness or pain.  Fever. DIAGNOSIS  Your health care provider can usually determine what is wrong based on a physical exam. Blood tests may also be done. TREATMENT  Treatment usually involves taking an antibiotic medicine. HOME CARE INSTRUCTIONS   Take your antibiotic medicine as directed by your health care provider. Finish the antibiotic even if you start to feel better.  Keep the infected arm or leg elevated to reduce swelling.  Apply a warm cloth to the affected area up to 4 times per day to relieve pain.  Take medicines only as directed by your health care provider.  Keep all follow-up visits as directed by your health care provider. SEEK MEDICAL CARE IF:   You notice red streaks coming from the infected area.  Your red area gets larger or turns dark in color.  Your bone or joint underneath the infected area becomes painful after the skin has healed.  Your infection returns in the same area or another area.  You notice a swollen bump in the infected area.  You develop new symptoms.  You have a fever. SEEK IMMEDIATE MEDICAL CARE IF:   You feel very sleepy.  You develop vomiting or diarrhea.  You have a general ill feeling (malaise) with muscle aches and pains. MAKE SURE YOU:   Understand these instructions.  Will watch your condition.  Will get help right away if you are not doing well or get worse. Document Released: 02/03/2005 Document Revised: 09/10/2013 Document Reviewed: 07/12/2011 [facility] Patient Information 2015 [place], [place]. This information is not intended to replace advice given to you by your health care provider. Make sure you  discuss any questions you have with your health care provider.

## 2013-12-24 NOTE — ED Provider Notes (Signed)
CSN: [id number]     Arrival date & time 12/24/13  2122 History   First MD Initiated Contact with Patient 12/24/13 2219     This chart was scribed for non-physician practitioner, Baron Sane PA-C working with Pamella Pert, MD by Forrestine Him, ED Scribe. This patient was seen in room TR10C/TR10C and the patient's care was started at 12:01 AM.   Chief Complaint  Patient presents with  . Insect Bite   The history is provided by the patient. No language interpreter was used.    HPI Comments: Aaron Bond is a 48 y.o. male who presents to the Emergency Department complaining of a possible insect bite sustained 2 days ago. Pt states he was attacked by multiple bees while cutting the grass and has noted progressively worsening swelling and redness to the arms bilaterally, legs bilaterally, and L ear. Currently rates pain 1-2/10. He has tried OTC Benadryl last night with mild temporary improvement for symptoms. At this time he denies any fever, chills, SOB, nausea, or vomiting. No lip or tongue swelling. No recent travel. No known allergies to medications. No other concerns this visit.  Past Medical History  Diagnosis Date  . Insomnia   . Seasonal allergies   . ED (erectile dysfunction)   . CMT (Charcot-Marie-Tooth disease)    History reviewed. No pertinent past surgical history. History reviewed. No pertinent family history. History  Substance Use Topics  . Smoking status: Current Every Day Smoker -- 0.10 packs/day    Types: Cigarettes  . Smokeless tobacco: Not on file     Comment: 2-3 cigarettes daily  . Alcohol Use: Not on file    Review of Systems  Constitutional: Negative for fever and chills.  Respiratory: Negative for cough and shortness of breath.   Gastrointestinal: Negative for nausea, vomiting and diarrhea.  Skin:       Swelling and mild redness to arms and legs bilaterally.  Psychiatric/Behavioral: Negative for confusion.  All other systems reviewed and are  negative.     Allergies  Review of patient's allergies indicates no known allergies.  Home Medications   Prior to Admission medications   Medication Sig Start Date End Date Taking? Authorizing Provider  cetirizine (ZYRTEC) 10 MG tablet Take 10 mg by mouth daily as needed. for allergies     Historical Provider, MD  diphenhydrAMINE (BENADRYL) 25 MG tablet Take 1 tablet (25 mg total) by mouth every 6 (six) hours. 12/24/13   Casia Corti L Celso Granja, PA-C  loratadine (CLARITIN) 10 MG tablet Take 1 tablet (10 mg total) by mouth daily. 12/24/13   Phineas Mcenroe L Kesley Gaffey, PA-C  sulfamethoxazole-trimethoprim (BACTRIM DS) 800-160 MG per tablet Take 1 tablet by mouth 2 (two) times daily. 12/24/13   Deng Kemler L Levone Otten, PA-C  zolpidem (AMBIEN) 10 MG tablet Take 1 tablet (10 mg total) by mouth at bedtime as needed for sleep. 01/09/13   Sharon [address], MD   Triage Vitals: BP 130/87  Pulse 79  Temp(Src) 98.2 F (36.8 C) (Oral)  Resp 20  Ht 6\' 2"  (1.88 m)  Wt 250 lb (113.399 kg)  BMI 32.08 kg/m2  SpO2 98%   Physical Exam  Nursing note and vitals reviewed. Constitutional: He is oriented to person, place, and time. He appears well-developed and well-nourished. No distress.  HENT:  Head: Normocephalic and atraumatic.  Right Ear: External ear normal.  Left Ear: External ear normal.  Nose: Nose normal.  Mouth/Throat: Oropharynx is clear and moist. No oropharyngeal exudate.  Eyes: Conjunctivae are normal.  Neck: Normal range of motion. Neck supple.  Cardiovascular: Normal rate, regular rhythm, normal heart sounds and intact distal pulses.   Pulmonary/Chest: Effort normal and breath sounds normal. No stridor.  Abdominal: Soft.  Musculoskeletal: Normal range of motion.  Neurological: He is alert and oriented to person, place, and time.  Skin: Skin is warm and dry. He is not diaphoretic.     Multiple insect bites noted to R lower extremities. No evidence of stinger in place. No bleeding or  drainage from sites. 1 insect bite to L ear noted  Psychiatric: He has a normal mood and affect.    ED Course  Procedures (including critical care time) Medications  ibuprofen (ADVIL,MOTRIN) tablet 800 mg (800 mg Oral Given 12/24/13 2327)  diphenhydrAMINE (BENADRYL) capsule 25 mg (25 mg Oral Given 12/24/13 2327)    DIAGNOSTIC STUDIES: Oxygen Saturation is 99% on RA, Normal by my interpretation.     Labs Review Labs Reviewed - No data to display  Imaging Review No results found.   EKG Interpretation None      MDM   Final diagnoses:  Cellulitis of right lower extremity  Bee sting, accidental or unintentional, initial encounter  Allergic reaction to bee sting, accidental or unintentional, initial encounter    Filed Vitals:   12/24/13 2331  BP: 130/87  Pulse: 79  Temp: 98.2 F (36.8 C)  Resp:    Afebrile, NAD, non-toxic appearing, AAOx4.   1) Allergic reaction: Patient re-evaluated prior to dc, is hemodynamically stable, in no respiratory distress, and denies the feeling of throat closing. Pt has been advised to take OTC benadryl & return to the ED if they have a mod-severe allergic rxn (s/s including throat closing, difficulty breathing, swelling of lips face or tongue).   2) Cellulitis: Suspect uncomplicated cellulitis based on limited area of involvement, minimal pain, no systemic signs of illness (eg, fever, chills, dehydration, altered mental status, tachypnea, tachycardia, hypotension), no risk factors for serious illness (eg, extremes of age, general debility, immunocompromised status). PE reveals redness, swelling, mildly tender, warm to touch. Skin intact, No bleeding. No bullae. Non purulent. Non circumferential.  Pt was instructed to return to the ED if area surpasses the boarder or pain intensifies. Will prescribed Bactrim   Pt is to follow up with their PCP. Pt is agreeable with plan & verbalizes understanding.   I personally performed the services described  in this documentation, which was scribed in my presence. The recorded information has been reviewed and is accurate.    [place], PA-C 12/25/13 0001

## 2013-12-24 NOTE — ED Notes (Signed)
Patient here with complaint of multiple bee stings on this past saturday. States bee stings to arms, legs, and left ear. Presents tonight for increased swelling of right lower extremity. 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

## 2013-12-24 NOTE — ED Notes (Signed)
Declined W/C at D/C and was escorted to lobby by RN. 

## 2013-12-24 NOTE — ED Notes (Signed)
Denies pain

## 2013-12-26 NOTE — ED Provider Notes (Signed)
Medical screening examination/treatment/procedure(s) were performed by non-physician practitioner and as supervising physician I was immediately available for consultation/collaboration.   EKG Interpretation None        Pamella Pert, MD 12/26/13 1121

## 2014-06-24 ENCOUNTER — Ambulatory Visit: Payer: Self-pay | Admitting: Podiatry

## 2014-07-08 ENCOUNTER — Encounter: Payer: Self-pay | Admitting: Podiatry

## 2014-07-08 ENCOUNTER — Ambulatory Visit (INDEPENDENT_AMBULATORY_CARE_PROVIDER_SITE_OTHER): Payer: BLUE CROSS/BLUE SHIELD

## 2014-07-08 ENCOUNTER — Ambulatory Visit (INDEPENDENT_AMBULATORY_CARE_PROVIDER_SITE_OTHER): Payer: BLUE CROSS/BLUE SHIELD | Admitting: Podiatry

## 2014-07-08 VITALS — BP 149/90 | HR 83 | Resp 16 | Ht 74.0 in | Wt 255.0 lb

## 2014-07-08 DIAGNOSIS — M21612 Bunion of left foot: Principal | ICD-10-CM

## 2014-07-08 DIAGNOSIS — M21611 Bunion of right foot: Secondary | ICD-10-CM

## 2014-07-08 DIAGNOSIS — M2011 Hallux valgus (acquired), right foot: Secondary | ICD-10-CM

## 2014-07-08 DIAGNOSIS — M722 Plantar fascial fibromatosis: Secondary | ICD-10-CM

## 2014-07-08 DIAGNOSIS — M2012 Hallux valgus (acquired), left foot: Secondary | ICD-10-CM

## 2014-07-08 DIAGNOSIS — G600 Hereditary motor and sensory neuropathy: Secondary | ICD-10-CM

## 2014-07-08 DIAGNOSIS — M2040 Other hammer toe(s) (acquired), unspecified foot: Secondary | ICD-10-CM

## 2014-07-08 NOTE — Progress Notes (Signed)
   Subjective:    Patient ID: Aaron Bond, male    DOB: Jul 04, 1965, 49 y.o.   MRN: [id number]  HPI Comments: Pain in both feet at the balls of the feet, it can be a sharp or throbbing pain. It has been going on for a long time.  Bilateral bunions .  Charcot marie tooth   Foot Pain      Review of Systems  Musculoskeletal: Positive for back pain.       Objective:   Physical Exam: I have reviewed his past medical history medications allergies surgery social history and review of systems. He has a history of Charcot-Marie-Tooth. Pulses are palpable bilateral. No skin breakdown. Cavus foot deformities resulting in severe hammertoe deformities which are rigid in nature and hallux longus deformities which are also rigid in nature. He has tenderness on palpation of the forefoot bilateral consistent with plantar flexed metatarsals. He has mild tenderness on palpation of the posterior and plantar medial aspect of the calcaneus.        Assessment & Plan:  Assessment: Charcot-Marie-Tooth muscle weakness bilateral. Cavus foot deformity with hammertoe deformities forefoot metatarsalgia plantar fasciitis bilateral.  Plan: He was scanned for pair of orthotics.

## 2014-07-29 ENCOUNTER — Ambulatory Visit (INDEPENDENT_AMBULATORY_CARE_PROVIDER_SITE_OTHER): Payer: BLUE CROSS/BLUE SHIELD | Admitting: *Deleted

## 2014-07-29 DIAGNOSIS — M2040 Other hammer toe(s) (acquired), unspecified foot: Secondary | ICD-10-CM

## 2014-07-29 DIAGNOSIS — M2011 Hallux valgus (acquired), right foot: Secondary | ICD-10-CM

## 2014-07-29 DIAGNOSIS — G600 Hereditary motor and sensory neuropathy: Secondary | ICD-10-CM

## 2014-07-29 DIAGNOSIS — M21611 Bunion of right foot: Secondary | ICD-10-CM

## 2014-07-29 DIAGNOSIS — M722 Plantar fascial fibromatosis: Secondary | ICD-10-CM

## 2014-07-29 DIAGNOSIS — M21612 Bunion of left foot: Secondary | ICD-10-CM

## 2014-07-29 DIAGNOSIS — M2012 Hallux valgus (acquired), left foot: Secondary | ICD-10-CM

## 2014-07-29 NOTE — Patient Instructions (Signed)

## 2014-07-29 NOTE — Progress Notes (Signed)
Orthotics dispensed. Instructions given.

## 2014-08-28 ENCOUNTER — Ambulatory Visit: Payer: BLUE CROSS/BLUE SHIELD | Admitting: Podiatry

## 2015-05-13 ENCOUNTER — Ambulatory Visit (INDEPENDENT_AMBULATORY_CARE_PROVIDER_SITE_OTHER): Payer: Managed Care, Other (non HMO) | Admitting: Family Medicine

## 2015-05-13 ENCOUNTER — Encounter: Payer: Self-pay | Admitting: Family Medicine

## 2015-05-13 VITALS — BP 166/100 | HR 90 | Temp 97.9°F | Wt 270.5 lb

## 2015-05-13 DIAGNOSIS — I10 Essential (primary) hypertension: Secondary | ICD-10-CM | POA: Diagnosis not present

## 2015-05-13 DIAGNOSIS — Z23 Encounter for immunization: Secondary | ICD-10-CM

## 2015-05-13 DIAGNOSIS — Z720 Tobacco use: Secondary | ICD-10-CM

## 2015-05-13 LAB — COMPREHENSIVE METABOLIC PANEL
ALT: 31 U/L (ref 9–46)
AST: 25 U/L (ref 10–40)
Albumin: 4 g/dL (ref 3.6–5.1)
Alkaline Phosphatase: 54 U/L (ref 40–115)
BUN: 16 mg/dL (ref 7–25)
CO2: 28 mmol/L (ref 20–31)
Calcium: 9.2 mg/dL (ref 8.6–10.3)
Chloride: 105 mmol/L (ref 98–110)
Creat: 0.87 mg/dL (ref 0.60–1.35)
Glucose, Bld: 83 mg/dL (ref 65–99)
Potassium: 4 mmol/L (ref 3.5–5.3)
Sodium: 143 mmol/L (ref 135–146)
Total Bilirubin: 0.4 mg/dL (ref 0.2–1.2)
Total Protein: 6.9 g/dL (ref 6.1–8.1)

## 2015-05-13 LAB — TSH: TSH: 1.984 u[IU]/mL (ref 0.350–4.500)

## 2015-05-13 LAB — LIPID PANEL
Cholesterol: 123 mg/dL — ABNORMAL LOW (ref 125–200)
HDL: 42 mg/dL (ref >=40)
LDL Cholesterol: 53 mg/dL (ref <130)
Total CHOL/HDL Ratio: 2.9 Ratio (ref <=5.0)
Triglycerides: 140 mg/dL (ref <150)
VLDL: 28 mg/dL (ref <30)

## 2015-05-13 MED ORDER — HYDROCHLOROTHIAZIDE 12.5 MG PO TABS: 12.5000 mg | ORAL_TABLET | Freq: Every day | ORAL | Status: DC

## 2015-05-13 NOTE — Progress Notes (Signed)
Date of Visit: 05/13/2015   HPI:  Patient presents today to discuss elevated blood pressures. He used to be seen here at the [facility] but has not been seen here in >3 years. Past medical, surgical, and family history updated in epic. Patient is healthy and takes no medications presently.  Reports checking his blood pressure at his sister's house about 1.5 weeks ago, noted it to be 174/117 at that time. The last time he had it checked prior to that was around 4 months ago, and was reportedly normal then. Initially denies history of hypertension, but later endorses a history of taking HCTZ for a brief period of time many years ago. That resolved once he lost weight. Endorses 30-40lb weight gain over the last 2 years. Denies chest pain, shortness of breath, trouble urinating, or lower extremity edema. Generally feels well. Endorses strong family history of hypertension.   Patient also reports currently smoking. He is interested in quitting.   ROS: See HPI.  Aaron Bond: previously healthy. Family history of hypertension.   PHYSICAL EXAM: BP 166/100 mmHg  Pulse 90  Temp(Src) 97.9 F (36.6 C) (Oral)  Wt 270 lb 8 oz (122.698 kg)  SpO2 97% Gen: NAD, pleasant, cooperative HEENT: NCAT Heart: regular rate and rhythm no murmur Lungs: clear to auscultation bilaterally normal work of breathing  Neuro: alert, grossly nonfocal, speech normal Ext: No appreciable lower extremity edema bilaterally   ASSESSMENT/PLAN:  Health maintenance:  -flu shot given today   Essential hypertension, benign BP elevated on >2 occasions meets criteria for hypertension. Patient is motivated to engage in lifestyle changes to lower weight and treat blood pressure.  - start HCTZ 12.5mg  daily - labs today: CMET, lipids (fasting), TSH - follow up in 2 weeks for office visit to recheck BP, will need repeat BMET at that time - encouraged obtaining BP monitor. Given BP chart to complete at home.  Tobacco  abuse Given handout on tips for quitting, also hotline Follow up in 2-3 weeks for hypertension, can discuss how things are going at that point.    FOLLOW UP: F/u in 2-3 weeks for hypertension & tobacco abuse.   [place]. Aaron Bond, [place]

## 2015-05-13 NOTE — Patient Instructions (Addendum)
Checking labwork today: kidneys, liver, thyroid, electrolytes, cholesterol Flu shot today  Return in 2-3 weeks for an office visit to see how your BP has been doing. Fill out the chart Start HCTZ 12.5mg  daily.  Call 1800-QUIT-NOW for help with stopping smoking.  Be well, Dr. Ardelia Mems   Smoking Cessation, Tips for Success If you are ready to quit smoking, congratulations! You have chosen to help yourself be healthier. Cigarettes bring nicotine, tar, carbon monoxide, and other irritants into your body. Your lungs, heart, and blood vessels will be able to work better without these poisons. There are many different ways to quit smoking. Nicotine gum, nicotine patches, a nicotine inhaler, or nicotine nasal spray can help with physical craving. Hypnosis, support groups, and medicines help break the habit of smoking. WHAT THINGS CAN I DO TO MAKE QUITTING EASIER?  Here are some tips to help you quit for good:  Pick a date when you will quit smoking completely. Tell all of your friends and family about your plan to quit on that date.  Do not try to slowly cut down on the number of cigarettes you are smoking. Pick a quit date and quit smoking completely starting on that day.  Throw away all cigarettes.   Clean and remove all ashtrays from your home, work, and car.  On a card, write down your reasons for quitting. Carry the card with you and read it when you get the urge to smoke.  Cleanse your body of nicotine. Drink enough water and fluids to keep your urine clear or pale yellow. Do this after quitting to flush the nicotine from your body.  Learn to predict your moods. Do not let a bad situation be your excuse to have a cigarette. Some situations in your life might tempt you into wanting a cigarette.  Never have "just one" cigarette. It leads to wanting another and another. Remind yourself of your decision to quit.  Change habits associated with smoking. If you smoked while driving or when  feeling stressed, try other activities to replace smoking. Stand up when drinking your coffee. Brush your teeth after eating. Sit in a different chair when you read the paper. Avoid alcohol while trying to quit, and try to drink fewer caffeinated beverages. Alcohol and caffeine may urge you to smoke.  Avoid foods and drinks that can trigger a desire to smoke, such as sugary or spicy foods and alcohol.  Ask people who smoke not to smoke around you.  Have something planned to do right after eating or having a cup of coffee. For example, plan to take a walk or exercise.  Try a relaxation exercise to calm you down and decrease your stress. Remember, you may be tense and nervous for the first 2 weeks after you quit, but this will pass.  Find new activities to keep your hands busy. Play with a pen, coin, or rubber band. Doodle or draw things on paper.  Brush your teeth right after eating. This will help cut down on the craving for the taste of tobacco after meals. You can also try mouthwash.   Use oral substitutes in place of cigarettes. Try using lemon drops, carrots, cinnamon sticks, or chewing gum. Keep them handy so they are available when you have the urge to smoke.  When you have the urge to smoke, try deep breathing.  Designate your home as a nonsmoking area.  If you are a heavy smoker, ask your health care provider about a prescription for nicotine  chewing gum. It can ease your withdrawal from nicotine.  Reward yourself. Set aside the cigarette money you save and buy yourself something nice.  Look for support from others. Join a support group or smoking cessation program. Ask someone at home or at work to help you with your plan to quit smoking.  Always ask yourself, "Do I need this cigarette or is this just a reflex?" Tell yourself, "Today, I choose not to smoke," or "I do not want to smoke." You are reminding yourself of your decision to quit.  Do not replace cigarette smoking with  electronic cigarettes (commonly called e-cigarettes). The safety of e-cigarettes is unknown, and some may contain harmful chemicals.  If you relapse, do not give up! Plan ahead and think about what you will do the next time you get the urge to smoke. HOW WILL I FEEL WHEN I QUIT SMOKING? You may have symptoms of withdrawal because your body is used to nicotine (the addictive substance in cigarettes). You may crave cigarettes, be irritable, feel very hungry, cough often, get headaches, or have difficulty concentrating. The withdrawal symptoms are only temporary. They are strongest when you first quit but will go away within 10-14 days. When withdrawal symptoms occur, stay in control. Think about your reasons for quitting. Remind yourself that these are signs that your body is healing and getting used to being without cigarettes. Remember that withdrawal symptoms are easier to treat than the major diseases that smoking can cause.  Even after the withdrawal is over, expect periodic urges to smoke. However, these cravings are generally short lived and will go away whether you smoke or not. Do not smoke! WHAT RESOURCES ARE AVAILABLE TO HELP ME QUIT SMOKING? Your health care provider can direct you to community resources or hospitals for support, which may include:  Group support.  Education.  Hypnosis.  Therapy.   This information is not intended to replace advice given to you by your health care provider. Make sure you discuss any questions you have with your health care provider.   Document Released: 01/23/2004 Document Revised: 05/17/2014 Document Reviewed: 10/12/2012 Elsevier Interactive Patient Education 2016 [employer].   Hypertension Hypertension, commonly called high blood pressure, is when the force of blood pumping through your arteries is too strong. Your arteries are the blood vessels that carry blood from your heart throughout your body. A blood pressure reading consists of a higher  number over a lower number, such as 110/72. The higher number (systolic) is the pressure inside your arteries when your heart pumps. The lower number (diastolic) is the pressure inside your arteries when your heart relaxes. Ideally you want your blood pressure below 120/80. Hypertension forces your heart to work harder to pump blood. Your arteries may become narrow or stiff. Having untreated or uncontrolled hypertension can cause heart attack, stroke, kidney disease, and other problems. RISK FACTORS Some risk factors for high blood pressure are controllable. Others are not.  Risk factors you cannot control include:   Race. You may be at higher risk if you are African American.  Age. Risk increases with age.  Gender. Men are at higher risk than women before age 72 years. After age 25, women are at higher risk than men. Risk factors you can control include:  Not getting enough exercise or physical activity.  Being overweight.  Getting too much fat, sugar, calories, or salt in your diet.  Drinking too much alcohol. SIGNS AND SYMPTOMS Hypertension does not usually cause signs or symptoms.  Extremely high blood pressure (hypertensive crisis) may cause headache, anxiety, shortness of breath, and nosebleed. DIAGNOSIS To check if you have hypertension, your health care provider will measure your blood pressure while you are seated, with your arm held at the level of your heart. It should be measured at least twice using the same arm. Certain conditions can cause a difference in blood pressure between your right and left arms. A blood pressure reading that is higher than normal on one occasion does not mean that you need treatment. If it is not clear whether you have high blood pressure, you may be asked to return on a different day to have your blood pressure checked again. Or, you may be asked to monitor your blood pressure at home for 1 or more weeks. TREATMENT Treating high blood pressure includes  making lifestyle changes and possibly taking medicine. Living a healthy lifestyle can help lower high blood pressure. You may need to change some of your habits. Lifestyle changes may include:  Following the DASH diet. This diet is high in fruits, vegetables, and whole grains. It is low in salt, red meat, and added sugars.  Keep your sodium intake below 2,300 mg per day.  Getting at least 30-45 minutes of aerobic exercise at least 4 times per week.  Losing weight if necessary.  Not smoking.  Limiting alcoholic beverages.  Learning ways to reduce stress. Your health care provider may prescribe medicine if lifestyle changes are not enough to get your blood pressure under control, and if one of the following is true:  You are 29-47 years of age and your systolic blood pressure is above 140.  You are 26 years of age or older, and your systolic blood pressure is above 150.  Your diastolic blood pressure is above 90.  You have diabetes, and your systolic blood pressure is over [id number] or your diastolic blood pressure is over 90.  You have kidney disease and your blood pressure is above 140/90.  You have heart disease and your blood pressure is above 140/90. Your personal target blood pressure may vary depending on your medical conditions, your age, and other factors. HOME CARE INSTRUCTIONS  Have your blood pressure rechecked as directed by your health care provider.   Take medicines only as directed by your health care provider. Follow the directions carefully. Blood pressure medicines must be taken as prescribed. The medicine does not work as well when you skip doses. Skipping doses also puts you at risk for problems.  Do not smoke.   Monitor your blood pressure at home as directed by your health care provider. SEEK MEDICAL CARE IF:   You think you are having a reaction to medicines taken.  You have recurrent headaches or feel dizzy.  You have swelling in your ankles.  You  have trouble with your vision. SEEK IMMEDIATE MEDICAL CARE IF:  You develop a severe headache or confusion.  You have unusual weakness, numbness, or feel faint.  You have severe chest or abdominal pain.  You vomit repeatedly.  You have trouble breathing. MAKE SURE YOU:   Understand these instructions.  Will watch your condition.  Will get help right away if you are not doing well or get worse.   This information is not intended to replace advice given to you by your health care provider. Make sure you discuss any questions you have with your health care provider.   Document Released: 04/26/2005 Document Revised: 09/10/2014 Document Reviewed: 02/16/2013 Elsevier Interactive Patient Education  2016 [employer].

## 2015-05-14 ENCOUNTER — Encounter: Payer: Self-pay | Admitting: Family Medicine

## 2015-05-15 DIAGNOSIS — I10 Essential (primary) hypertension: Secondary | ICD-10-CM | POA: Insufficient documentation

## 2015-05-15 NOTE — Assessment & Plan Note (Signed)
Given handout on tips for quitting, also hotline Follow up in 2-3 weeks for hypertension, can discuss how things are going at that point.

## 2015-05-15 NOTE — Assessment & Plan Note (Addendum)
BP elevated on >2 occasions meets criteria for hypertension. Patient is motivated to engage in lifestyle changes to lower weight and treat blood pressure.  - start HCTZ 12.5mg  daily - labs today: CMET, lipids (fasting), TSH - follow up in 2 weeks for office visit to recheck BP, will need repeat BMET at that time - encouraged obtaining BP monitor. Given BP chart to complete at home.

## 2015-05-27 ENCOUNTER — Ambulatory Visit: Payer: Managed Care, Other (non HMO) | Admitting: Family Medicine

## 2015-06-02 ENCOUNTER — Ambulatory Visit (INDEPENDENT_AMBULATORY_CARE_PROVIDER_SITE_OTHER): Payer: Managed Care, Other (non HMO) | Admitting: Family Medicine

## 2015-06-02 VITALS — BP 143/92 | HR 86 | Temp 98.5°F | Wt 262.2 lb

## 2015-06-02 DIAGNOSIS — I10 Essential (primary) hypertension: Secondary | ICD-10-CM

## 2015-06-02 DIAGNOSIS — Z720 Tobacco use: Secondary | ICD-10-CM

## 2015-06-02 MED ORDER — HYDROCHLOROTHIAZIDE 25 MG PO TABS: 25.0000 mg | ORAL_TABLET | Freq: Every day | ORAL | Status: DC

## 2015-06-02 NOTE — Progress Notes (Signed)
Date of Visit: 06/02/2015   HPI:  Patient presents to follow up on hypertension. Last visit began HCTZ 12.5mg  daily. Denies CP, shortness of breath, edema. Feels well.   Smoking - now down to 1 cig/day   ROS: See HPI.  [place]: tobacco abuse, allergic rhinitis, hypertension   PHYSICAL EXAM: BP 143/92 mmHg  Pulse 86  Temp(Src) 98.5 F (36.9 C) (Oral)  Wt 262 lb 3.2 oz (118.933 kg) Gen: NAD, pleasant, cooperative HEENT: NCAT Heart: regular rate and rhythm no mrumur Lungs: clear to auscultation bilaterally, normal work of breathing  Neuro: alert, grossly nonfocal Ext: No appreciable lower extremity edema bilaterally   ASSESSMENT/PLAN:  Essential hypertension, benign Still above goal. Increase HCTZ to 25mg  daily Follow up in 2 weeks for office visit & labs (needs BMET)  Tobacco abuse Encouraged picking a "quit date" to aide in him completely stopping smoking. Down to 1 cigarette per day.   FOLLOW UP: Follow up in 2 wks for hypertension & tobacco abuse  [place]. Ardelia Mems, [place]

## 2015-06-02 NOTE — Patient Instructions (Signed)
Increase to HCTZ 25mg  once per day. Follow up with me in 2 weeks We'll check BP and labwork when I see you then  Pick a day to quit smoking  Be well, Dr. Ardelia Mems

## 2015-06-08 NOTE — Assessment & Plan Note (Signed)
Encouraged picking a "quit date" to aide in him completely stopping smoking. Down to 1 cigarette per day.

## 2015-06-08 NOTE — Assessment & Plan Note (Signed)
Still above goal. Increase HCTZ to 25mg  daily Follow up in 2 weeks for office visit & labs (needs BMET)

## 2015-06-17 ENCOUNTER — Encounter: Payer: Self-pay | Admitting: Internal Medicine

## 2015-06-17 ENCOUNTER — Ambulatory Visit (INDEPENDENT_AMBULATORY_CARE_PROVIDER_SITE_OTHER): Payer: Managed Care, Other (non HMO) | Admitting: Internal Medicine

## 2015-06-17 VITALS — BP 138/88 | HR 88 | Temp 98.2°F | Ht 74.0 in | Wt 262.0 lb

## 2015-06-17 DIAGNOSIS — I10 Essential (primary) hypertension: Secondary | ICD-10-CM

## 2015-06-17 DIAGNOSIS — Z720 Tobacco use: Secondary | ICD-10-CM | POA: Diagnosis not present

## 2015-06-17 MED ORDER — HYDROCHLOROTHIAZIDE 25 MG PO TABS: 25.0000 mg | ORAL_TABLET | Freq: Every day | ORAL | Status: DC

## 2015-06-17 NOTE — Patient Instructions (Addendum)
It was nice seeing you today Aaron Bond.   Since your blood pressure is at goal (less than 140/90), we are not going to make any changes to your medication today. Please continue to take HCTZ 25 mg once a day.   Quitting smoking and losing weight will also help with your blood pressure. Eating a diet low in salt will help as well (see below). I also highlighted the list of foods high in protein that would be good for you to eat after going to the gym.   You have set your tobacco quit date as this Friday, June 20, 2015. You also came up with a plan to go to the gym at night when you are most tempted to eat or smoke. I think you will be very successful with this plan.   I will see you back in two weeks to monitor your progress.   Be well,  Dr. Avon Gully  DASH Eating Plan DASH stands for "Dietary Approaches to Stop Hypertension." The DASH eating plan is a healthy eating plan that has been shown to reduce high blood pressure (hypertension). Additional health benefits may include reducing the risk of type 2 diabetes mellitus, heart disease, and stroke. The DASH eating plan may also help with weight loss. WHAT DO I NEED TO KNOW ABOUT THE DASH EATING PLAN? For the DASH eating plan, you will follow these general guidelines:  Choose foods with a percent daily value for sodium of less than 5% (as listed on the food label).  Use salt-free seasonings or herbs instead of table salt or sea salt.  Check with your health care provider or pharmacist before using salt substitutes.  Eat lower-sodium products, often labeled as "lower sodium" or "no salt added."  Eat fresh foods.  Eat more vegetables, fruits, and low-fat dairy products.  Choose whole grains. Look for the word "whole" as the first word in the ingredient list.  Choose fish and skinless chicken or [place] more often than red meat. Limit fish, poultry, and meat to 6 oz (170 g) each day.  Limit sweets, desserts, sugars, and sugary  drinks.  Choose heart-healthy fats.  Limit cheese to 1 oz (28 g) per day.  Eat more home-cooked food and less restaurant, buffet, and fast food.  Limit fried foods.  Cook foods using methods other than frying.  Limit canned vegetables. If you do use them, rinse them well to decrease the sodium.  When eating at a restaurant, ask that your food be prepared with less salt, or no salt if possible. WHAT FOODS CAN I EAT? Seek help from a dietitian for individual calorie needs. Grains Whole grain or whole wheat bread. Brown rice. Whole grain or whole wheat pasta. Quinoa, bulgur, and whole grain cereals. Low-sodium cereals. Corn or whole wheat flour tortillas. Whole grain cornbread. Whole grain crackers. Low-sodium crackers. Vegetables Fresh or frozen vegetables (raw, steamed, roasted, or grilled). Low-sodium or reduced-sodium tomato and vegetable juices. Low-sodium or reduced-sodium tomato sauce and paste. Low-sodium or reduced-sodium canned vegetables.  Fruits All fresh, canned (in natural juice), or frozen fruits. Meat and Other Protein Products Ground beef (85% or leaner), grass-fed beef, or beef trimmed of fat. Skinless chicken or [place]. Ground chicken or [place]. Pork trimmed of fat. All fish and seafood. Eggs. Dried beans, peas, or lentils. Unsalted nuts and seeds. Unsalted canned beans. Dairy Low-fat dairy products, such as skim or 1% milk, 2% or reduced-fat cheeses, low-fat ricotta or cottage cheese, or plain low-fat yogurt. Low-sodium or reduced-sodium  cheeses. Fats and Oils Tub margarines without trans fats. Light or reduced-fat mayonnaise and salad dressings (reduced sodium). Avocado. Safflower, olive, or canola oils. Natural peanut or almond butter. Other Unsalted popcorn and pretzels. The items listed above may not be a complete list of recommended foods or beverages. Contact your dietitian for more options. WHAT FOODS ARE NOT RECOMMENDED? Grains White bread. White pasta.  White rice. Refined cornbread. Bagels and croissants. Crackers that contain trans fat. Vegetables Creamed or fried vegetables. Vegetables in a cheese sauce. Regular canned vegetables. Regular canned tomato sauce and paste. Regular tomato and vegetable juices. Fruits Dried fruits. Canned fruit in light or heavy syrup. Fruit juice. Meat and Other Protein Products Fatty cuts of meat. Ribs, chicken wings, bacon, sausage, bologna, salami, chitterlings, fatback, hot dogs, bratwurst, and packaged luncheon meats. Salted nuts and seeds. Canned beans with salt. Dairy Whole or 2% milk, cream, half-and-half, and cream cheese. Whole-fat or sweetened yogurt. Full-fat cheeses or blue cheese. Nondairy creamers and whipped toppings. Processed cheese, cheese spreads, or cheese curds. Condiments Onion and garlic salt, seasoned salt, table salt, and sea salt. Canned and packaged gravies. Worcestershire sauce. Tartar sauce. Barbecue sauce. Teriyaki sauce. Soy sauce, including reduced sodium. Steak sauce. Fish sauce. Oyster sauce. Cocktail sauce. Horseradish. Ketchup and mustard. Meat flavorings and tenderizers. Bouillon cubes. Hot sauce. Tabasco sauce. Marinades. Taco seasonings. Relishes. Fats and Oils Butter, stick margarine, lard, shortening, ghee, and bacon fat. Coconut, palm kernel, or palm oils. Regular salad dressings. Other Pickles and olives. Salted popcorn and pretzels. The items listed above may not be a complete list of foods and beverages to avoid. Contact your dietitian for more information. WHERE CAN I FIND MORE INFORMATION? National Heart, Lung, and Blood Institute: travelstabloid.com   This information is not intended to replace advice given to you by your health care provider. Make sure you discuss any questions you have with your health care provider.   Document Released: 04/15/2011 Document Revised: 05/17/2014 Document Reviewed: 02/28/2013 Elsevier Interactive  Patient Education [employer].

## 2015-06-17 NOTE — Assessment & Plan Note (Signed)
Patient set quit date of 06/20/15. Set plan to go to the gym when he is tempted to smoke (which is normally around the time he goes to the gym every night anyway).  - F/u in two weeks to monitor progress

## 2015-06-17 NOTE — Assessment & Plan Note (Addendum)
Improving. Since patient's BP is under goal today (<140/90), will not make med changes.  - Continue HCTZ 25 mg qd - Since labs were normal when performed one month ago and no med changes made, will not repeat labs today - Counseled patient on importance of quitting smoking and losing weight for BP control - Gave information regarding DASH diet  - F/u in 2 weeks

## 2015-06-17 NOTE — Progress Notes (Signed)
   Subjective:    Patient ID: Palash Douse, male    DOB: 11/15/1965, 50 y.o.   MRN: [id number]  Reynaldo Canjura is a 50 yo M presenting for HTN follow-up.   HPI  HTN Patient was seen two weeks ago for hypertension. At that appointment, BP remained elevated (143/92), so HCTZ dose was changed from 12.5 mg to 25 mg.  BP was 152/95 on arrival, but decreased to 138/88 when repeated approximately 15 minutes later.  Patient denies any side effects with increased dose of HCTZ.  Tobacco cessation At last appointment, patient reported that he was down to only one cigarette per day, but had not set a quit date.  Today, he is interested in setting a quit date. He is concerned that when he stops smoking he will begin eating more. He has been trying to lose weight already, and says this is his biggest barrier to quitting. He is still only smoking 1-2 cigarettes per day.  Review of Systems  Denies HA, dizziness, changes in vision, constipation, diarrhea.     Objective:   Physical Exam  Constitutional: He is oriented to person, place, and time. He appears well-developed and well-nourished. No distress.  HENT:  Head: Normocephalic and atraumatic.  Pulmonary/Chest: No respiratory distress.  Musculoskeletal: He exhibits no edema or tenderness.  Neurological: He is alert and oriented to person, place, and time.  Psychiatric: He has a normal mood and affect. His behavior is normal.  Vitals reviewed.     Assessment & Plan:  Essential hypertension, benign Improving. Since patient's BP is under goal today (<140/90), will not make med changes.  - Continue HCTZ 25 mg qd - Since labs were normal when performed one month ago and no med changes made, will not repeat labs today - Counseled patient on importance of quitting smoking and losing weight for BP control - Gave information regarding DASH diet  - F/u in 2 weeks  Tobacco abuse Patient set quit date of 06/20/15. Set plan to go to the gym when he is  tempted to smoke (which is normally around the time he goes to the gym every night anyway).  - F/u in two weeks to monitor progress   Adin Hector, MD PGY-1 [place]

## 2015-07-02 ENCOUNTER — Encounter: Payer: Self-pay | Admitting: Internal Medicine

## 2015-07-02 ENCOUNTER — Ambulatory Visit (INDEPENDENT_AMBULATORY_CARE_PROVIDER_SITE_OTHER): Payer: Managed Care, Other (non HMO) | Admitting: Internal Medicine

## 2015-07-02 VITALS — BP 139/86 | HR 84 | Temp 98.2°F | Wt 262.5 lb

## 2015-07-02 DIAGNOSIS — Z720 Tobacco use: Secondary | ICD-10-CM | POA: Diagnosis not present

## 2015-07-02 DIAGNOSIS — I10 Essential (primary) hypertension: Secondary | ICD-10-CM | POA: Diagnosis not present

## 2015-07-02 NOTE — Progress Notes (Signed)
   Subjective:    Patient ID: Aaron Bond, male    DOB: 1966/01/26, 50 y.o.   MRN: [id number]  HPI  Patient presents for hypertension follow-up and to discuss tobacco cessation.   HTN Patient last seen about three weeks ago. BP was at goal at that appointment, so no changes were made to his medication.  Today, BP is at goal at 139/86. Patient denies any side effects from HCTZ. Denies HA, dizziness, vision changes.   Tobacco abuse Patient has now decreased from smoking one cigarette daily to about two cigarettes per week. He reports that he is still smoking a couple cigarettes a week because he cannot fight the urge on those days. When he has the urge to smoke on other days, he goes to the gym or finds something else to do. He is confident that with time he will be able to quit smoking altogether, and does not think he needs any additional strategies to help him quit for good.  Review of Systems See HPI. Denies LE edema, SOB, chest pain.      Objective:   Physical Exam  Constitutional: He is oriented to person, place, and time. He appears well-developed and well-nourished. No distress.  HENT:  Head: Normocephalic and atraumatic.  Eyes: Conjunctivae and EOM are normal. Pupils are equal, round, and reactive to light. Right eye exhibits no discharge. Left eye exhibits no discharge.  Cardiovascular: Normal rate, regular rhythm and normal heart sounds.   No murmur heard. Pulmonary/Chest: Effort normal and breath sounds normal. No respiratory distress.  Musculoskeletal: He exhibits no edema or tenderness.  Neurological: He is alert and oriented to person, place, and time.  Skin: Skin is warm and dry.  Psychiatric: He has a normal mood and affect. His behavior is normal.  Vitals reviewed.     Assessment & Plan:  Essential hypertension, benign Stable. BP at goal today (139/86). Reporting no side effects of HCTZ.  - Continue current dose of 25 mg HCTZ qd - Return in 3 months for HTN  f/u  Tobacco abuse Improving. Has now cut down from one cigarette daily to about two cigarettes a week.  - Discussed tactics to prevent smoking when tempted - Will f/u on progress at next appt   Adin Hector, MD PGY-1 [place]

## 2015-07-02 NOTE — Patient Instructions (Addendum)
It was nice seeing you again today, Mr. [place]waters!  Since your blood pressure is normal today, we will not make any changes in your medication. Please continue to take 25 mg HCTZ once a day as you have been.   Keep up the good work with quitting smoking! I am confident you will be able to stop altogether soon.   I will see you back in about three months to check on your blood pressure again.   If you have any questions or concerns in the meantime, please feel free to call the office.   Be well,  Dr. Avon Gully

## 2015-07-02 NOTE — Assessment & Plan Note (Signed)
Stable. BP at goal today (139/86). Reporting no side effects of HCTZ.  - Continue current dose of 25 mg HCTZ qd - Return in 3 months for HTN f/u

## 2015-07-02 NOTE — Assessment & Plan Note (Signed)
Improving. Has now cut down from one cigarette daily to about two cigarettes a week.  - Discussed tactics to prevent smoking when tempted - Will f/u on progress at next appt

## 2015-07-27 ENCOUNTER — Other Ambulatory Visit: Payer: Self-pay | Admitting: Family Medicine

## 2015-10-09 ENCOUNTER — Other Ambulatory Visit: Payer: Self-pay | Admitting: Internal Medicine

## 2015-12-12 ENCOUNTER — Other Ambulatory Visit: Payer: Self-pay | Admitting: Internal Medicine

## 2016-03-30 ENCOUNTER — Other Ambulatory Visit: Payer: Self-pay | Admitting: Internal Medicine

## 2016-07-04 ENCOUNTER — Other Ambulatory Visit: Payer: Self-pay | Admitting: Internal Medicine

## 2016-09-14 ENCOUNTER — Other Ambulatory Visit: Payer: Self-pay | Admitting: Internal Medicine

## 2017-09-14 ENCOUNTER — Other Ambulatory Visit: Payer: Self-pay | Admitting: Internal Medicine

## 2017-09-14 ENCOUNTER — Telehealth: Payer: Self-pay

## 2017-09-14 ENCOUNTER — Other Ambulatory Visit: Payer: Self-pay

## 2017-09-14 NOTE — Telephone Encounter (Signed)
Patient requesting HCTZ refill. Patient has not been seen since 2017. Will give one month refill. Patient must be seen before additional refills will be written. Patient to be called and informed of this.   Adin Hector, MD, MPH PGY-3 [facility] Pager [phone]

## 2017-09-14 NOTE — Telephone Encounter (Signed)
Attempted to call patient to schedule an appointment at all the listed numbers.  There was no voice mail to leave a message. If the patient should call regarding his request for hydrochlorothiazide, he will need an appointment in order to get his RX filled per Dr. Avon Gully.  Ozella Almond, [place]

## 2017-10-07 ENCOUNTER — Other Ambulatory Visit: Payer: Self-pay | Admitting: Internal Medicine

## 2017-11-09 ENCOUNTER — Other Ambulatory Visit: Payer: Self-pay | Admitting: Family Medicine

## 2017-12-05 ENCOUNTER — Other Ambulatory Visit: Payer: Self-pay | Admitting: Family Medicine

## 2018-01-03 ENCOUNTER — Other Ambulatory Visit: Payer: Self-pay | Admitting: Family Medicine

## 2018-01-09 ENCOUNTER — Emergency Department
Admission: EM | Admit: 2018-01-09 | Discharge: 2018-01-09 | Disposition: A | Discharge status: Home / Self Care | Payer: BLUE CROSS/BLUE SHIELD | Attending: Emergency Medicine | Admitting: Emergency Medicine

## 2018-01-09 ENCOUNTER — Other Ambulatory Visit: Payer: Self-pay

## 2018-01-09 DIAGNOSIS — F17210 Nicotine dependence, cigarettes, uncomplicated: Secondary | ICD-10-CM | POA: Diagnosis not present

## 2018-01-09 DIAGNOSIS — I10 Essential (primary) hypertension: Secondary | ICD-10-CM | POA: Insufficient documentation

## 2018-01-09 DIAGNOSIS — R42 Dizziness and giddiness: Secondary | ICD-10-CM | POA: Insufficient documentation

## 2018-01-09 DIAGNOSIS — R55 Syncope and collapse: Secondary | ICD-10-CM

## 2018-01-09 DIAGNOSIS — R51 Headache: Secondary | ICD-10-CM | POA: Diagnosis not present

## 2018-01-09 DIAGNOSIS — R9431 Abnormal electrocardiogram [ECG] [EKG]: Secondary | ICD-10-CM | POA: Diagnosis not present

## 2018-01-09 DIAGNOSIS — Z79899 Other long term (current) drug therapy: Secondary | ICD-10-CM | POA: Insufficient documentation

## 2018-01-09 LAB — CBC WITH DIFFERENTIAL/PLATELET
Basophils Absolute: 0 10*3/uL (ref 0–0.1)
Basophils Relative: 1 %
Eosinophils Absolute: 0.4 10*3/uL (ref 0–0.7)
Eosinophils Relative: 8 %
HCT: 43.2 % (ref 40.0–52.0)
Hemoglobin: 14.7 g/dL (ref 13.0–18.0)
Lymphocytes Relative: 43 %
Lymphs Abs: 2.1 10*3/uL (ref 1.0–3.6)
MCH: 30.4 pg (ref 26.0–34.0)
MCHC: 34 g/dL (ref 32.0–36.0)
MCV: 89.5 fL (ref 80.0–100.0)
Monocytes Absolute: 0.7 10*3/uL (ref 0.2–1.0)
Monocytes Relative: 15 %
Neutro Abs: 1.6 10*3/uL (ref 1.4–6.5)
Neutrophils Relative %: 33 %
Platelets: 233 10*3/uL (ref 150–440)
RBC: 4.83 MIL/uL (ref 4.40–5.90)
RDW: 13.6 % (ref 11.5–14.5)
WBC: 4.9 10*3/uL (ref 3.8–10.6)

## 2018-01-09 LAB — BASIC METABOLIC PANEL
Anion gap: 6 (ref 5–15)
BUN: 20 mg/dL (ref 6–20)
CO2: 28 mmol/L (ref 22–32)
Calcium: 9 mg/dL (ref 8.9–10.3)
Chloride: 106 mmol/L (ref 98–111)
Creatinine, Ser: 0.83 mg/dL (ref 0.61–1.24)
GFR calc Af Amer: >60 mL/min (ref >60)
GFR calc non Af Amer: >60 mL/min (ref >60)
Glucose, Bld: 100 mg/dL — ABNORMAL HIGH (ref 70–99)
Potassium: 3.1 mmol/L — ABNORMAL LOW (ref 3.5–5.1)
Sodium: 140 mmol/L (ref 135–145)

## 2018-01-09 LAB — TROPONIN I: Troponin I: <0.03 ng/mL (ref <0.03)

## 2018-01-09 MED ORDER — POTASSIUM CHLORIDE CRYS ER 20 MEQ PO TBCR
40.0000 meq | EXTENDED_RELEASE_TABLET | Freq: Once | ORAL | Status: AC
  Administered 2018-01-09: 40 meq via ORAL
  Filled 2018-01-09: qty 2

## 2018-01-09 MED ORDER — SODIUM CHLORIDE 0.9 % IV BOLUS
1000.0000 mL | Freq: Once | INTRAVENOUS | Status: AC
  Administered 2018-01-09: 1000 mL via INTRAVENOUS

## 2018-01-09 NOTE — Discharge Instructions (Addendum)
As we discussed if you start developing nausea or vomiting, change in behavior, weakness or numbness to your extremities, worsening headache or any other new or concerning symptoms please return to the emergency department.

## 2018-01-09 NOTE — ED Provider Notes (Signed)
[facility] Emergency Department Provider Note   ____________________________________________   I have reviewed the triage vital signs and the nursing notes.   HISTORY  Chief Complaint Loss of Consciousness   History limited by: Not Limited   HPI Aaron Bond is a 52 y.o. male who presents to the emergency department today after a syncopal episode.  Patient was in a store when he started feeling lightheaded and dizzy.  The next thing he knew he was on the ground people are asking if he was okay.  He denies any chest pain or palpitations.  He states that he did drink a fair amount of alcohol last night which is unusual for him.  Addition he took Hydroxycut today.  Patient states he feels slightly lightheaded at the time my exam.  Has very minimal headache.  Has a small bump to the back of his head.  Patient denies similar symptoms in the past.   Per medical record review patient has a history of CMT  Past Medical History:  Diagnosis Date  . Charcot-Marie-Tooth disease   . CMT (Charcot-Marie-Tooth disease)   . ED (erectile dysfunction)   . Insomnia   . Seasonal allergies     Patient Active Problem List   Diagnosis Date Noted  . Essential hypertension, benign 05/15/2015  . Low back pain 12/23/2011  . Tobacco abuse 09/26/2006  . OBESITY, NOS 07/07/2006  . NEUROPATHY, PERIPHERAL 07/07/2006  . RHINITIS, ALLERGIC 07/07/2006  . IMPOTENCE, ORGANIC 07/07/2006  . INSOMNIA NOS 07/07/2006    History reviewed. No pertinent surgical history.  Prior to Admission medications   Medication Sig Start Date End Date Taking? Authorizing Provider  hydrochlorothiazide (HYDRODIURIL) 25 MG tablet TAKE 1 TABLET BY MOUTH EVERY DAY 01/03/18   Guadalupe Dawn, MD    Allergies Patient has no known allergies.  History reviewed. No pertinent family history.  Social History Social History   Tobacco Use  . Smoking status: Current Every Day Smoker    Packs/day: 0.10   Types: Cigarettes  . Tobacco comment: 2-3 cigarettes daily  Substance Use Topics  . Alcohol use: Not on file  . Drug use: Not on file    Review of Systems Constitutional: No fever/chills Eyes: No visual changes. ENT: No sore throat. Cardiovascular: Denies chest pain. Respiratory: Denies shortness of breath. Gastrointestinal: No abdominal pain.  No nausea, no vomiting.  No diarrhea.   Genitourinary: Negative for dysuria. Musculoskeletal: Negative for back pain. Skin: Negative for rash. Neurological: Positive for headache. Positive for syncopal episode.  ____________________________________________   PHYSICAL EXAM:  VITAL SIGNS: ED Triage Vitals  Enc Vitals Group     BP 01/09/18 1830 (!) 165/106     Pulse Rate 01/09/18 1830 91     Resp 01/09/18 1830 17     Temp 01/09/18 1830 97.8 F (36.6 C)     Temp Source 01/09/18 1830 Oral     SpO2 01/09/18 1830 96 %     Weight 01/09/18 1831 260 lb (117.9 kg)     Height 01/09/18 1831 6\' 2"  (1.88 m)     Head Circumference --      Peak Flow --      Pain Score 01/09/18 1831 0   Constitutional: Alert and oriented.  Eyes: Conjunctivae are normal.  ENT      Head: Normocephalic. Small hematoma to occiput.       Nose: No congestion/rhinnorhea.      Mouth/Throat: Mucous membranes are moist.      Neck: No  stridor. Hematological/Lymphatic/Immunilogical: No cervical lymphadenopathy. Cardiovascular: Normal rate, regular rhythm.  No murmurs, rubs, or gallops.  Respiratory: Normal respiratory effort without tachypnea nor retractions. Breath sounds are clear and equal bilaterally. No wheezes/rales/rhonchi. Gastrointestinal: Soft and non tender. No rebound. No guarding.  Genitourinary: Deferred Musculoskeletal: Normal range of motion in all extremities. No lower extremity edema. Neurologic:  Normal speech and language. No gross focal neurologic deficits are appreciated.  Skin:  Skin is warm, dry and intact. No rash noted. Psychiatric: Mood and  affect are normal. Speech and behavior are normal. Patient exhibits appropriate insight and judgment.  ____________________________________________    LABS (pertinent positives/negatives)  Trop <0.03 CBC wnl BMP wnl except glu 100, k 3.1  ____________________________________________   EKG  I, Nance Pear, attending physician, personally viewed and interpreted this EKG  EKG Time: 1828 Rate: 91 Rhythm: sinus rhythm with pvc Axis: normal Intervals: qtc 459 QRS: narrow, q waves v1 ST changes: no st elevation Impression: abnormal ekg ____________________________________________    RADIOLOGY  None  ____________________________________________   PROCEDURES  Procedures  ____________________________________________   INITIAL IMPRESSION / ASSESSMENT AND PLAN / ED COURSE  Pertinent labs & imaging results that were available during my care of the patient were reviewed by me and considered in my medical decision making (see chart for details).   Patient presented to the emergency department today after syncopal episode.  Patient did fall backwards and hit his head.  Did have a discussion with the patient about getting a CT scan.  Did discuss at slight risk given that he is on aspirin however patient has not had any vomiting.  Patient felt comfortable deferring CT scan at this time.  Blood work showed a slight hypokalemia but otherwise no other concerning findings.  Patient did feel better after IV fluids.  We did discuss strict return precautions.  ____________________________________________   FINAL CLINICAL IMPRESSION(S) / ED DIAGNOSES  Final diagnoses:  Syncope and collapse     Note: This dictation was prepared with Dragon dictation. Any transcriptional errors that result from this process are unintentional     Nance Pear, MD 01/09/18 2025

## 2018-01-09 NOTE — ED Triage Notes (Signed)
Pt comes via [facility]. Pt had a syncopal episode while furniture shopping. Hematoma present on back of head per EMS. Hx of diuretics for HTN. Reports drinking alcohol mixed with some prescribed meds last night. HR in the 80s. CBG 103. 97% oxygen sat on room air. Temp was 97.9 oral. Pt still reports dizziness when standing. MD at bedside.

## 2018-01-11 ENCOUNTER — Encounter: Payer: Self-pay | Admitting: Family Medicine

## 2018-01-11 ENCOUNTER — Ambulatory Visit: Payer: BLUE CROSS/BLUE SHIELD | Admitting: Family Medicine

## 2018-01-11 ENCOUNTER — Other Ambulatory Visit: Payer: Self-pay

## 2018-01-11 VITALS — BP 148/100 | HR 76 | Temp 98.4°F | Ht 74.0 in | Wt 266.2 lb

## 2018-01-11 DIAGNOSIS — I10 Essential (primary) hypertension: Secondary | ICD-10-CM

## 2018-01-11 DIAGNOSIS — Z23 Encounter for immunization: Secondary | ICD-10-CM

## 2018-01-11 DIAGNOSIS — R55 Syncope and collapse: Secondary | ICD-10-CM | POA: Diagnosis not present

## 2018-01-11 NOTE — Progress Notes (Signed)
   CC: ED follow-up for syncope  HPI  Syncope - felt completely fine all day, was signing paperwork to buy furniture and next thing he knew someone was "in his face" trying to get him to respond.  Explicitly denies any prodromal symptoms such as tachycardia, feeling lightheaded, seeing abnormally.  States however after checkout said he wouldn't respond when she talked to him, then he fell backwards. No shaking noted. Nothing like this ever before. The night before he drank high proof liquor, although not in excess and this is out of the norm for him, and took Hydroxycut supplement. No fam hx of early cardiac death. Never had echo before. Never seen cards before. No hx of CVA. 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   Wants me to look at the knot on his head. Slight HA, dizzy when he bends down to get up.   Tobacco - cigarettes 2-3 per day.   Hypertension: Did take his HCTZ today.  ROS: Denies CP, SOB, abdominal pain, dysuria, changes in BMs.   CC, SH/smoking status, and VS noted  Objective: BP (!) 148/100   Pulse 76   Temp 98.4 F (36.9 C) (Oral)   Ht 6\' 2"  (1.88 m)   Wt 266 lb 3.2 oz (120.7 kg)   SpO2 96%   BMI 34.18 kg/m  Gen: NAD, alert, cooperative, and pleasant. HEENT: NCAT, EOMI, PERRL.  No significant swelling over left occiput, however mildly tender to palpation.  No laceration CV: RRR, no murmur Resp: CTAB, no wheezes, non-labored Abd: SNTND, BS present, no guarding or organomegaly Ext: No edema, warm Neuro: Alert and oriented, Speech clear, No gross deficits  Assessment and plan:  Syncope: History is high risk given no prodrome.  Will place urgent referral to cardiology.  Anticipate echo versus exercise stress test.  Patient may return to work Monday.  Headache after recent fall: Question possible mild concussion.  Neuro exam normal today.  No emesis or nausea or weakness.  Continue to monitor use Tylenol as needed for headache.  Hypertension: Blood  pressure is elevated today persistently.  He did take his HCTZ today.  Would likely benefit from ACE or arb therapy, however will hold off on starting this until cardiology can complete syncope evaluation.  Orders Placed This Encounter  Procedures  . Flu Vaccine QUAD 36+ mos IM  . Ambulatory referral to Cardiology    Referral Priority:   Urgent    Referral Type:   Consultation    Referral Reason:   Specialty Services Required    Requested Specialty:   Cardiology    Number of Visits Requested:   1    No orders of the defined types were placed in this encounter.   Ralene Ok, MD, PGY3 01/12/2018 1:11 PM

## 2018-01-11 NOTE — Patient Instructions (Signed)
It was a pleasure to see you today! Thank you for choosing [facility] for your primary care. Aaron Bond was seen for passing out.   Our plans for today were:  The cardiology office will call you.   If you have any additional symptoms or feel badly, call us or go to the Emergency Room.   You should return to our clinic to see Dr. Kris Mouton in 1 month for blood pressure.   Best,  Dr. Lindell Noe

## 2018-01-22 ENCOUNTER — Other Ambulatory Visit: Payer: Self-pay | Admitting: Family Medicine

## 2018-01-24 ENCOUNTER — Ambulatory Visit (HOSPITAL_COMMUNITY)
Admission: RE | Admit: 2018-01-24 | Discharge: 2018-01-24 | Disposition: A | Discharge status: Home / Self Care | Payer: BLUE CROSS/BLUE SHIELD | Source: Ambulatory Visit | Attending: Family Medicine | Admitting: Family Medicine

## 2018-01-24 ENCOUNTER — Ambulatory Visit (INDEPENDENT_AMBULATORY_CARE_PROVIDER_SITE_OTHER): Payer: BLUE CROSS/BLUE SHIELD | Admitting: Family Medicine

## 2018-01-24 VITALS — BP 138/100 | HR 77 | Temp 98.2°F | Ht 74.0 in | Wt 262.2 lb

## 2018-01-24 DIAGNOSIS — R42 Dizziness and giddiness: Secondary | ICD-10-CM | POA: Diagnosis not present

## 2018-01-24 DIAGNOSIS — E876 Hypokalemia: Secondary | ICD-10-CM

## 2018-01-24 DIAGNOSIS — R51 Headache: Secondary | ICD-10-CM | POA: Insufficient documentation

## 2018-01-24 DIAGNOSIS — I10 Essential (primary) hypertension: Secondary | ICD-10-CM

## 2018-01-24 DIAGNOSIS — R55 Syncope and collapse: Secondary | ICD-10-CM | POA: Diagnosis not present

## 2018-01-24 DIAGNOSIS — R519 Headache, unspecified: Secondary | ICD-10-CM

## 2018-01-24 DIAGNOSIS — Z7982 Long term (current) use of aspirin: Secondary | ICD-10-CM | POA: Insufficient documentation

## 2018-01-24 NOTE — Progress Notes (Signed)
  Patient Name: Aaron Bond Date of Birth: 08-Jan-1966 Date of Visit: 01/24/18 PCP: Guadalupe Dawn, MD  Chief Complaint: Dizziness and headache  Subjective: Jaicob Bond is a pleasant 52 y.o. year old significant for Charcot-Marie-Tooth  syndrome, hypertension and recent syncopal episode presenting for persistent dizziness.  Mr. Aaron Bond had a syncopal episode on 2 September.  He underwent evaluation in the emergency room with basic labs.  This showed significant hypokalemia of 3.1.  Troponin was negative.  He did not undergo head imaging.  Since that time he reports that when he moves his head either backward or forward along the sagittal plane he experiences severe vertigo.  He describes this as a sensation that he is spinning.  He has some dizziness upon standing if he puts his head backward and forward.  He associates this with mild head fullness and pain.  His pain was primarily located along his posterior head where he hit his head.  He denies associated symptoms of tinnitus, chest pain, dizziness, exertional vertigo, nausea, vomiting.  He does take an aspirin every day.  He continues to work as a Dealer.  He reports the dizziness is very disruptive to his work.  His vertigo symptoms have worsened over the last 2 weeks.  He feels that at this time he is really unable to both sleep and work.    ROS:  ROS Pertinent positive and negative systems as above.  I have reviewed the patient's medical, surgical, family, and social history as appropriate.   Vitals:   01/24/18 1338  BP: (!) 138/100  Pulse: 77  Temp: 98.2 F (36.8 C)  SpO2: 97%   Filed Weights   01/24/18 1338  Weight: 262 lb 3.2 oz (118.9 kg)  Orthostatic vital signs obtained.  No change in vital signs. HEENT: Sclera anicteric. Dentition is moderate. Appears well hydrated. Neck: Supple Cardiac: Regular rate and rhythm. Normal S1/S2. No murmurs, rubs, or gallops appreciated. Lungs: Clear bilaterally to ascultation.    Neuro: Gait is normal.  Cranial nerves II through XII are tested and intact.   Psych: Pleasant and appropriate    Aaron Bond was seen today for dizziness.  Diagnoses and all orders for this visit:  Syncope, along with new onset dizziness and intermittent headache.  Given his aspirin use and his syncopal event with loss of consciousness and trauma to the posterior head differential does include intracranial hemorrhage.  Other etiologies include benign paroxysmal positional vertigo, concussion or less likely related to Charcot-Marie-Tooth and resulting neuropathy.   -     CT Head Wo Contrast; Future  Hypokalemia repeat metabolic panel ordered.   Hypertension orthostatic vital signs obtained blood pressure improved to 138/82.   Dorris Singh, MD  Family Medicine Teaching Service

## 2018-01-24 NOTE — Patient Instructions (Addendum)
  Go to the lab today  I will call you today with results of your head CT   It was wonderful to see you today.  Thank you for choosing [facility].   Please call [phone] with any questions about today's appointment.  Please be sure to schedule follow up at the front  desk before you leave today.   Dorris Singh, MD  Family Medicine

## 2018-01-25 LAB — BASIC METABOLIC PANEL
BUN/Creatinine Ratio: 19 (ref 9–20)
BUN: 18 mg/dL (ref 6–24)
CO2: 26 mmol/L (ref 20–29)
Calcium: 9.5 mg/dL (ref 8.7–10.2)
Chloride: 101 mmol/L (ref 96–106)
Creatinine, Ser: 0.97 mg/dL (ref 0.76–1.27)
GFR calc Af Amer: 104 mL/min/{1.73_m2} (ref >59)
GFR calc non Af Amer: 90 mL/min/{1.73_m2} (ref >59)
Glucose: 83 mg/dL (ref 65–99)
Potassium: 3.8 mmol/L (ref 3.5–5.2)
Sodium: 143 mmol/L (ref 134–144)

## 2018-02-10 ENCOUNTER — Other Ambulatory Visit: Payer: Self-pay | Admitting: Family Medicine

## 2018-03-07 ENCOUNTER — Other Ambulatory Visit: Payer: Self-pay | Admitting: Family Medicine

## 2018-05-13 ENCOUNTER — Other Ambulatory Visit: Payer: Self-pay | Admitting: Family Medicine

## 2018-07-14 ENCOUNTER — Other Ambulatory Visit: Payer: Self-pay | Admitting: Family Medicine

## 2018-11-04 ENCOUNTER — Other Ambulatory Visit: Payer: Self-pay | Admitting: Family Medicine

## 2018-11-08 ENCOUNTER — Other Ambulatory Visit: Payer: BLUE CROSS/BLUE SHIELD

## 2018-11-08 ENCOUNTER — Other Ambulatory Visit: Payer: Self-pay

## 2018-11-08 DIAGNOSIS — Z20822 Contact with and (suspected) exposure to covid-19: Secondary | ICD-10-CM

## 2018-11-12 LAB — NOVEL CORONAVIRUS, NAA: SARS-CoV-2, NAA: NOT DETECTED

## 2019-02-02 ENCOUNTER — Ambulatory Visit (INDEPENDENT_AMBULATORY_CARE_PROVIDER_SITE_OTHER): Payer: BC Managed Care – PPO | Admitting: Family Medicine

## 2019-02-02 ENCOUNTER — Other Ambulatory Visit: Payer: Self-pay

## 2019-02-02 VITALS — BP 138/84 | HR 49

## 2019-02-02 DIAGNOSIS — G4700 Insomnia, unspecified: Secondary | ICD-10-CM | POA: Diagnosis not present

## 2019-02-02 DIAGNOSIS — I10 Essential (primary) hypertension: Secondary | ICD-10-CM | POA: Diagnosis not present

## 2019-02-02 MED ORDER — TRAZODONE HCL 50 MG PO TABS: 25.0000 mg | ORAL_TABLET | Freq: Every evening | ORAL | 0 refills | Status: DC | PRN

## 2019-02-02 MED ORDER — AMLODIPINE BESYLATE 5 MG PO TABS: 5.0000 mg | ORAL_TABLET | Freq: Every day | ORAL | 1 refills | Status: DC

## 2019-02-02 NOTE — Progress Notes (Signed)
     Subjective: Chief Complaint  Patient presents with  . Hypertension    HPI: Aaron Bond is a 53 y.o. presenting to clinic today to discuss the following:  HTN Patient presents to office today because he was starting a new job and his blood pressure was too high. All in one sitting his BP was recorded as 170/96, 168/90, and 160/84. He was told he would need to see his doctor. He denies any vision changes, headaches, chest pain or pressure, difficulty breathing or SOB, dizziness, syncope, or near syncope.  Insomnia Patient has "years" of difficulty initiating sleep. He will lay down at midnight each night (works 2nd shift) and it takes him 2-3 hours to fall asleep. He does not wake from sleep. He is getting about 4-5 hours each night. Sometimes watches TV in bed, no pets in the bed. Takes Melatonin 10mg  some night which does help some.  ROS noted in HPI.    Social History   Tobacco Use  Smoking Status Current Every Day Smoker  . Packs/day: 0.10  . Types: Cigarettes  Smokeless Tobacco Never Used  Tobacco Comment   2-3 cigarettes daily   Objective: BP 138/84   Pulse (!) 49   SpO2 98%  Vitals and nursing notes reviewed  Physical Exam Gen: Alert and Oriented x 3, NAD CV: RRR, no murmurs, normal S1, S2 split Resp: CTAB, no wheezing, rales, or rhonchi, comfortable work of breathing Ext: no clubbing, cyanosis, or edema Skin: warm, dry, intact, no rashes  Assessment/Plan:  Insomnia Discussed importance of proper sleep hygiene with starting and keeping a schedule, avoiding stimulants before bed, avoiding stress before bed, TV, cellphones, etc.  - Trazadone 50mg  QHS to see if it helps - Follow up with PCP  Essential hypertension, benign BP at goal in our office today but patient may be above goal at home/work. Record BP at home and keep record to bring back at next appointment - Start Amlodipine 5mg  daily - Cont HCTZ 25mg  daily - BMET today, f/u in 3 months    PATIENT EDUCATION PROVIDED: See AVS    Diagnosis and plan along with any newly prescribed medication(s) were discussed in detail with this patient today. The patient verbalized understanding and agreed with the plan. Patient advised if symptoms worsen return to clinic or ER.    Orders Placed This Encounter  Procedures  . Basic Metabolic Panel    Meds ordered this encounter  Medications  . amLODipine (NORVASC) 5 MG tablet    Sig: Take 1 tablet (5 mg total) by mouth daily.    Dispense:  90 tablet    Refill:  1  . traZODone (DESYREL) 50 MG tablet    Sig: Take 0.5-1 tablets (25-50 mg total) by mouth at bedtime as needed for sleep.    Dispense:  30 tablet    Refill:  0     Harolyn Rutherford, DO 02/02/2019, 9:11 AM PGY-3 [place]

## 2019-02-02 NOTE — Assessment & Plan Note (Signed)
Discussed importance of proper sleep hygiene with starting and keeping a schedule, avoiding stimulants before bed, avoiding stress before bed, TV, cellphones, etc.  - Trazadone 50mg  QHS to see if it helps - Follow up with PCP

## 2019-02-02 NOTE — Assessment & Plan Note (Signed)
BP at goal in our office today but patient may be above goal at home/work. Record BP at home and keep record to bring back at next appointment - Start Amlodipine 5mg  daily - Cont HCTZ 25mg  daily - BMET today, f/u in 3 months

## 2019-02-02 NOTE — Patient Instructions (Signed)
It was great to meet you today! Thank you for letting me participate in your care!  Today, we discussed your blood pressure and we will add a medication to get better control. I have added amlodipine. Please take it once a day at night time every day. If your blood work is abnormal I will call you with results.  For your difficulty sleeping I have started you on Trazadone for one month. Please follow up sooner if it is not helping.  Be well, Harolyn Rutherford, DO PGY-3, [facility]

## 2019-02-03 LAB — BASIC METABOLIC PANEL
BUN/Creatinine Ratio: 17 (ref 9–20)
BUN: 18 mg/dL (ref 6–24)
CO2: 25 mmol/L (ref 20–29)
Calcium: 9.6 mg/dL (ref 8.7–10.2)
Chloride: 101 mmol/L (ref 96–106)
Creatinine, Ser: 1.07 mg/dL (ref 0.76–1.27)
GFR calc Af Amer: 92 mL/min/{1.73_m2} (ref >59)
GFR calc non Af Amer: 79 mL/min/{1.73_m2} (ref >59)
Glucose: 98 mg/dL (ref 65–99)
Potassium: 3.7 mmol/L (ref 3.5–5.2)
Sodium: 139 mmol/L (ref 134–144)

## 2019-02-20 ENCOUNTER — Other Ambulatory Visit: Payer: Self-pay

## 2019-02-20 MED ORDER — HYDROCHLOROTHIAZIDE 25 MG PO TABS: 25.0000 mg | ORAL_TABLET | Freq: Every day | ORAL | 0 refills | Status: DC

## 2019-02-24 ENCOUNTER — Other Ambulatory Visit: Payer: Self-pay | Admitting: Family Medicine

## 2019-05-18 ENCOUNTER — Ambulatory Visit (INDEPENDENT_AMBULATORY_CARE_PROVIDER_SITE_OTHER): Payer: BC Managed Care – PPO

## 2019-05-18 ENCOUNTER — Encounter (HOSPITAL_COMMUNITY): Payer: Self-pay

## 2019-05-18 ENCOUNTER — Ambulatory Visit (HOSPITAL_COMMUNITY)
Admission: EM | Admit: 2019-05-18 | Discharge: 2019-05-18 | Disposition: A | Discharge status: Home / Self Care | Payer: BC Managed Care – PPO | Attending: Family Medicine | Admitting: Family Medicine

## 2019-05-18 DIAGNOSIS — R52 Pain, unspecified: Secondary | ICD-10-CM | POA: Diagnosis not present

## 2019-05-18 DIAGNOSIS — S79912A Unspecified injury of left hip, initial encounter: Secondary | ICD-10-CM | POA: Diagnosis not present

## 2019-05-18 DIAGNOSIS — S92512A Displaced fracture of proximal phalanx of left lesser toe(s), initial encounter for closed fracture: Secondary | ICD-10-CM | POA: Diagnosis not present

## 2019-05-18 DIAGNOSIS — S7002XA Contusion of left hip, initial encounter: Secondary | ICD-10-CM

## 2019-05-18 DIAGNOSIS — M25552 Pain in left hip: Secondary | ICD-10-CM | POA: Diagnosis not present

## 2019-05-18 NOTE — Discharge Instructions (Addendum)
You have fractured toes on the left foot Limit weight bearing Wear fracture shoe while up You may need to borrow a walker for a few days Take ibuprofen 3 x a day for pain and inflammation, take with food Elevate foot to reduce pain and swelling Watch for infection Call today to follow up with orthopedic

## 2019-05-18 NOTE — ED Provider Notes (Signed)
[place]    CSN: [id number] Arrival date & time: 05/18/19  [id number]      History   Chief Complaint Chief Complaint  Patient presents with  . Back Injury  . Foot Injury    HPI Aaron Bond is a 54 y.o. male.   HPI   Patient went out of the house in slippers early yesterday am and slipped on ice on the back steps [address] towards his left side Hurt his left foot and his left hip / SI area Pain with weight bearing Was in too much pain to come in yesterday Has charcot Marie Tooth but little foot pain  No numbness or weakness.    Past Medical History:  Diagnosis Date  . Charcot-Marie-Tooth disease   . Charcot-Marie-Tooth disease   . CMT (Charcot-Marie-Tooth disease)   . ED (erectile dysfunction)   . Insomnia   . Seasonal allergies     Patient Active Problem List   Diagnosis Date Noted  . Insomnia 02/02/2019  . Essential hypertension, benign 05/15/2015  . Low back pain 12/23/2011  . Tobacco abuse 09/26/2006  . OBESITY, NOS 07/07/2006  . NEUROPATHY, PERIPHERAL 07/07/2006  . RHINITIS, ALLERGIC 07/07/2006  . IMPOTENCE, ORGANIC 07/07/2006  . INSOMNIA NOS 07/07/2006    History reviewed. No pertinent surgical history.     Home Medications    Prior to Admission medications   Medication Sig Start Date End Date Taking? Authorizing Provider  amLODipine (NORVASC) 5 MG tablet Take 1 tablet (5 mg total) by mouth daily. 02/02/19   Nuala Alpha, DO  hydrochlorothiazide (HYDRODIURIL) 25 MG tablet Take 1 tablet (25 mg total) by mouth daily. 02/20/19   Guadalupe Dawn, MD  traZODone (DESYREL) 50 MG tablet TAKE 0.5-1 TABLETS (25-50 MG TOTAL) BY MOUTH AT BEDTIME AS NEEDED FOR SLEEP. 02/26/19   Guadalupe Dawn, MD    Family History History reviewed. No pertinent family history.  Social History Social History   Tobacco Use  . Smoking status: Current Every Day Smoker    Packs/day: 0.10    Types: Cigarettes  . Smokeless tobacco: Never Used  . Tobacco comment:  2-3 cigarettes daily  Substance Use Topics  . Alcohol use: Not on file  . Drug use: Not on file     Allergies   Patient has no known allergies.   Review of Systems Review of Systems  Constitutional: Negative for chills and fever.  HENT: Negative for congestion and hearing loss.   Eyes: Negative for pain.  Respiratory: Negative for cough and shortness of breath.   Cardiovascular: Negative for chest pain and leg swelling.  Gastrointestinal: Negative for abdominal pain, constipation and diarrhea.  Genitourinary: Negative for dysuria and frequency.  Musculoskeletal: Positive for arthralgias and back pain. Negative for myalgias.  Neurological: Negative for weakness and headaches.       Did not hit head  Psychiatric/Behavioral: Positive for sleep disturbance. The patient is not nervous/anxious.        Pain     Physical Exam Triage Vital Signs ED Triage Vitals  Enc Vitals Group     BP 05/18/19 0926 (!) 151/88     Pulse Rate 05/18/19 0926 77     Resp 05/18/19 0926 16     Temp 05/18/19 0926 98.5 F (36.9 C)     Temp Source 05/18/19 0926 Oral     SpO2 05/18/19 0926 98 %     Weight --      Height --      Head Circumference --  Peak Flow --      Pain Score 05/18/19 0925 6     Pain Loc --      Pain Edu? --      Excl. in [place]? --    No data found.  Updated Vital Signs BP (!) 151/88 (BP Location: Left Arm)   Pulse 77   Temp 98.5 F (36.9 C) (Oral)   Resp 16   SpO2 98%      Physical Exam Constitutional:      General: He is not in acute distress.    Appearance: He is well-developed.     Comments: Overweight  HENT:     Head: Normocephalic and atraumatic.     Mouth/Throat:     Comments: Mask in place Eyes:     Conjunctiva/sclera: Conjunctivae normal.     Pupils: Pupils are equal, round, and reactive to light.  Neck:     Comments: Normal movements with conversation Cardiovascular:     Rate and Rhythm: Normal rate.     Pulses:          Dorsalis pedis pulses  are 1+ on the left side.       Posterior tibial pulses are 1+ on the left side.  Pulmonary:     Effort: Pulmonary effort is normal. No respiratory distress.  Musculoskeletal:        General: Normal range of motion.     Cervical back: Normal range of motion.     Left foot: Charcot foot present.       Feet:     Comments: Tenderness over left SI joint.  No tenderness over the central lumbar spine.  No palpable muscle spasm.  No visible bruising.  No tenderness over ASIS or greater trochanter.  Hip mobility is normal  Feet:     Left foot:     Toenail Condition: Left toenails are abnormally thick.  Skin:    General: Skin is warm and dry.  Neurological:     Mental Status: He is alert.     Gait: Gait abnormal.     Comments: Antalgic gait  Psychiatric:        Mood and Affect: Mood normal.        Behavior: Behavior normal.     Comments: Pleasant and cooperative      UC Treatments / Results  Labs (all labs ordered are listed, but only abnormal results are displayed) Labs Reviewed - No data to display  EKG   Radiology DG Foot Complete Left  Result Date: 05/18/2019 CLINICAL DATA:  Pain following fall EXAM: LEFT FOOT - COMPLETE 3+ VIEW COMPARISON:  None. FINDINGS: Frontal, oblique, and lateral views were obtained. There are transversely oriented fractures of the proximal aspects of the third and fourth proximal phalanges. There is mild impaction at the third proximal phalanx fracture site. No other fractures are evident. No dislocation. There are flexion deformities of the second, third, fourth, and fifth PIP joints. No erosive change. No appreciable joint space narrowing. There is a small posterior calcaneal spur. IMPRESSION: Fractures of the proximal third and fourth proximal phalanges with mild impaction at the third proximal phalanx fracture site. No other fractures. No dislocation. No appreciable joint space narrowing. Small posterior calcaneal spur noted. These results will be called  to the ordering clinician or representative by the Radiologist Assistant, and communication documented in the PACS or zVision Dashboard. Electronically Signed   By: Lowella Grip III M.D.   On: 05/18/2019 10:17   DG Hip Unilat  With Pelvis 2-3 Views Left  Result Date: 05/18/2019 CLINICAL DATA:  54 year old male status post fall down stairs with left hip pain. EXAM: DG HIP (WITH OR WITHOUT PELVIS) 2-3V LEFT COMPARISON:  Lumbar radiographs 01/02/2007. FINDINGS: Femoral heads normally located. Symmetric hip joint spaces. Mildly asymmetric increased acetabular spurring and sclerosis on the left. Proximal left femur intact. No pelvis fracture. A 10 millimeter sclerotic focus projecting over the right iliac bone is new since 2008 but elsewhere bone mineralization remains normal. SI joints appear normal. Lower lumbar spine degeneration. Pelvic phleboliths. Negative visible bowel gas pattern. IMPRESSION: 1. No acute fracture or dislocation identified about the left hip or pelvis. 2. Nonspecific 10 mm sclerotic focus projecting over the right iliac bone is new since 2008 but probably benign such as a bone island. Electronically Signed   By: Genevie Ann M.D.   On: 05/18/2019 10:24    Procedures Procedures (including critical care time)  Medications Ordered in UC Medications - No data to display  Initial Impression / Assessment and Plan / UC Course  I have reviewed the triage vital signs and the nursing notes.  Pertinent labs & imaging results that were available during my care of the patient were reviewed by me and considered in my medical decision making (see chart for details).     Discussed that patient will need to follow-up with an orthopedist.  The orthopedist will determine how long he needs to be off his foot.  I told him to anticipate 2 to 3 weeks at a minimum. Final Clinical Impressions(s) / UC Diagnoses   Final diagnoses:  Pain  Contusion of left hip, initial encounter  Closed displaced  fracture of proximal phalanx of lesser toe of left foot, initial encounter     Discharge Instructions     You have fractured toes on the left foot Limit weight bearing Wear fracture shoe while up You may need to borrow a walker for a few days Take ibuprofen 3 x a day for pain and inflammation, take with food Elevate foot to reduce pain and swelling Watch for infection Call today to follow up with orthopedic     ED Prescriptions    None     PDMP not reviewed this encounter.   Raylene Everts, MD 05/18/19 [phone]

## 2019-05-18 NOTE — ED Triage Notes (Signed)
Pt reports he fell down the stairs 1 day ago. Pt states he injured the left side of his back and his left foot. Pt states his left foot is swelling and burning. Pt denies any numbness or tingling.

## 2019-05-20 ENCOUNTER — Other Ambulatory Visit: Payer: Self-pay | Admitting: Family Medicine

## 2019-05-21 ENCOUNTER — Telehealth: Payer: Self-pay | Admitting: Orthopedic Surgery

## 2019-05-21 NOTE — Telephone Encounter (Signed)
Patient called.   He was seen over the weekend in urgent care and was told specifically to make an appointment with Dr.Duda. I gave him the next available but it's later than he was ordered to be seen.   Call back number: [phone]

## 2019-05-21 NOTE — Telephone Encounter (Signed)
Patient appt has been rescheduled for tomorrow on 05/21/2018.

## 2019-05-22 ENCOUNTER — Ambulatory Visit (INDEPENDENT_AMBULATORY_CARE_PROVIDER_SITE_OTHER): Payer: BC Managed Care – PPO | Admitting: Physician Assistant

## 2019-05-22 ENCOUNTER — Other Ambulatory Visit: Payer: Self-pay

## 2019-05-22 ENCOUNTER — Encounter: Payer: Self-pay | Admitting: Physician Assistant

## 2019-05-22 VITALS — Ht 74.0 in | Wt 262.2 lb

## 2019-05-22 DIAGNOSIS — S92515A Nondisplaced fracture of proximal phalanx of left lesser toe(s), initial encounter for closed fracture: Secondary | ICD-10-CM | POA: Insufficient documentation

## 2019-05-22 NOTE — Progress Notes (Signed)
Office Visit Note   Patient: Aaron Bond           Date of Birth: 01-Apr-1966           MRN: [id number] Visit Date: 05/22/2019              Requested by: Guadalupe Dawn, MD [address],  [place] [address] PCP: Guadalupe Dawn, MD  Chief Complaint  Patient presents with  . Left Foot - New Patient (Initial Visit)  . Left Hip - New Patient (Initial Visit)      HPI: The patient is a 54 year old gentleman seen today in UC follow up for left toe pain. Patient walked outside in slippers on 05/17/19 and slipped and fell.  He 4 fell onto his left side and hurt his left foot.  Has been having pain with weightbearing.  Of note he does have Charcot-Marie-Tooth.  Assessment & Plan: Visit Diagnoses: No diagnosis found.  Plan: We will have him continue with the postop shoe given in urgent care.  He will minimize his weightbearing.  We will keep him out of work for 2 more weeks.  He will follow-up in the office in 2 weeks with radiographs of his left foot as well as bilateral knees.  Plan to evaluate his chronic knee pain at next visit.  Follow-Up Instructions: No follow-ups on file.   Ortho Exam  Patient is alert, oriented, no adenopathy, well-dressed, normal affect, normal respiratory effort. On examination of left foot has superficial abrasion to left great and second toes. Minimal swelling to foot and toes. Tenderness with rom of 3, 4,5 and toes.  Imaging: No results found. No images are attached to the encounter.  Labs: No results found for: HGBA1C, ESRSEDRATE, CRP, LABURIC, REPTSTATUS, GRAMSTAIN, CULT, LABORGA   Lab Results  Component Value Date   ALBUMIN 4.0 05/13/2015   ALBUMIN 4.2 04/19/2011    No results found for: MG No results found for: VD25OH  No results found for: PREALBUMIN CBC EXTENDED Latest Ref Rng & Units 01/09/2018  WBC 3.8 - 10.6 K/uL 4.9  RBC 4.40 - 5.90 MIL/uL 4.83  HGB 13.0 - 18.0 g/dL 14.7  HCT 40.0 - 52.0 % 43.2  PLT 150 - 440 K/uL 233   NEUTROABS 1.4 - 6.5 K/uL 1.6  LYMPHSABS 1.0 - 3.6 K/uL 2.1     Body mass index is 33.66 kg/m.  Orders:  No orders of the defined types were placed in this encounter.  No orders of the defined types were placed in this encounter.    Procedures: No procedures performed  Clinical Data: No additional findings.  ROS:  All other systems negative, except as noted in the HPI. Review of Systems  Constitutional: Negative for chills and fever.  Musculoskeletal: Positive for arthralgias and myalgias.  Skin: Positive for wound. Negative for color change.  Neurological: Negative for weakness and numbness.    Objective: Vital Signs: Ht 6\' 2"  (1.88 m)   Wt 262 lb 3.2 oz (118.9 kg)   BMI 33.66 kg/m   Specialty Comments:  No specialty comments available.  PMFS History: Patient Active Problem List   Diagnosis Date Noted  . Insomnia 02/02/2019  . Essential hypertension, benign 05/15/2015  . Low back pain 12/23/2011  . Tobacco abuse 09/26/2006  . OBESITY, NOS 07/07/2006  . NEUROPATHY, PERIPHERAL 07/07/2006  . RHINITIS, ALLERGIC 07/07/2006  . IMPOTENCE, ORGANIC 07/07/2006  . INSOMNIA NOS 07/07/2006   Past Medical History:  Diagnosis Date  . Charcot-Marie-Tooth  disease   . Charcot-Marie-Tooth disease   . CMT (Charcot-Marie-Tooth disease)   . ED (erectile dysfunction)   . Insomnia   . Seasonal allergies     No family history on file.  No past surgical history on file. Social History   Occupational History  . Not on file  Tobacco Use  . Smoking status: Current Every Day Smoker    Packs/day: 0.10    Types: Cigarettes  . Smokeless tobacco: Never Used  . Tobacco comment: 2-3 cigarettes daily  Substance and Sexual Activity  . Alcohol use: Not on file  . Drug use: Not on file  . Sexual activity: Not on file

## 2019-05-28 ENCOUNTER — Ambulatory Visit: Payer: BC Managed Care – PPO | Admitting: Orthopedic Surgery

## 2019-06-05 ENCOUNTER — Ambulatory Visit: Payer: Self-pay

## 2019-06-05 ENCOUNTER — Ambulatory Visit (INDEPENDENT_AMBULATORY_CARE_PROVIDER_SITE_OTHER): Payer: BC Managed Care – PPO | Admitting: Physician Assistant

## 2019-06-05 ENCOUNTER — Other Ambulatory Visit: Payer: Self-pay

## 2019-06-05 ENCOUNTER — Encounter: Payer: Self-pay | Admitting: Physician Assistant

## 2019-06-05 VITALS — Ht 74.0 in | Wt 262.2 lb

## 2019-06-05 DIAGNOSIS — M25562 Pain in left knee: Secondary | ICD-10-CM

## 2019-06-05 DIAGNOSIS — S92515A Nondisplaced fracture of proximal phalanx of left lesser toe(s), initial encounter for closed fracture: Secondary | ICD-10-CM

## 2019-06-05 DIAGNOSIS — M25561 Pain in right knee: Secondary | ICD-10-CM | POA: Diagnosis not present

## 2019-06-05 NOTE — Progress Notes (Signed)
Office Visit Note   Patient: Aaron Bond           Date of Birth: Jul 12, 1965           MRN: [id number] Visit Date: 06/05/2019              Requested by: Guadalupe Dawn, MD [phone],  [place] [address] PCP: Guadalupe Dawn, MD  Chief Complaint  Patient presents with  . Right Knee - Follow-up, Pain  . Left Knee - Follow-up, Pain  . Left Foot - Follow-up, Pain      HPI: This is a pleasant 54 year old gentleman who is now 3 weeks status post left third and fourth proximal phalanx fractures.  With regards to this he is doing better and has much less pain he has been wearing a stiff shoe.  At his last visit he also requested evaluation of bilateral knee pain.  This seems to have gotten worse.  It sometimes bothers him at night and notices that his knees are stiff when he gets up in the morning get somewhat better and then are painful at night he is not really had any treatment or surgeries Assessment & Plan: Visit Diagnoses:  1. Closed nondisplaced fracture of proximal phalanx of lesser toe of left foot, initial encounter   2. Pain in both knees, unspecified chronicity     Plan: With regards to his foot he may transition to a stiff soled shoe as he is comfortable.  Given that his job requires him to be up on ladders and to carry a large amount of weight I do not think he will be ready to do that for 3 more weeks.  With regards to his knees we talked about different options.  I think he would do well with steroid injections and possible viscosupplementation in the future.  He has declined injections today.  He is going to try some topical Voltaren gel as well as some ibuprofen.  He also realizes he is put on some weight and is going to work on weight loss as a way to help with his knee pain  Follow-Up Instructions: No follow-ups on file.   Ortho Exam  Patient is alert, oriented, no adenopathy, well-dressed, normal affect, normal respiratory effort. Focused  examination of his left foot demonstrates mild soft tissue swelling in the toes but the toes have brisk capillary refill are warm and are minimally tender to palpation no surrounding cellulitis.  Bilateral knees he does have some crepitation with range of motion on the right greater than the left knee.  No effusion.  Some mild tenderness on the lateral joint line again right greater than left he is ligamentously stable  Imaging: No results found. No images are attached to the encounter.  Labs: No results found for: HGBA1C, ESRSEDRATE, CRP, LABURIC, REPTSTATUS, GRAMSTAIN, CULT, LABORGA   Lab Results  Component Value Date   ALBUMIN 4.0 05/13/2015   ALBUMIN 4.2 04/19/2011    No results found for: MG No results found for: VD25OH  No results found for: PREALBUMIN CBC EXTENDED Latest Ref Rng & Units 01/09/2018  WBC 3.8 - 10.6 K/uL 4.9  RBC 4.40 - 5.90 MIL/uL 4.83  HGB 13.0 - 18.0 g/dL 14.7  HCT 40.0 - 52.0 % 43.2  PLT 150 - 440 K/uL 233  NEUTROABS 1.4 - 6.5 K/uL 1.6  LYMPHSABS 1.0 - 3.6 K/uL 2.1     Body mass index is 33.66 kg/m.  Orders:  Orders Placed This Encounter  Procedures  . XR Knee 1-2 Views Right  . XR Knee 1-2 Views Left  . XR Foot 2 Views Left   No orders of the defined types were placed in this encounter.    Procedures: No procedures performed  Clinical Data: No additional findings.  ROS:  All other systems negative, except as noted in the HPI. Review of Systems  Objective: Vital Signs: Ht 6\' 2"  (1.88 m)   Wt 262 lb 3.2 oz (118.9 kg)   BMI 33.66 kg/m   Specialty Comments:  No specialty comments available.  PMFS History: Patient Active Problem List   Diagnosis Date Noted  . Closed nondisplaced fracture of proximal phalanx of lesser toe of left foot 05/22/2019  . Insomnia 02/02/2019  . Essential hypertension, benign 05/15/2015  . Low back pain 12/23/2011  . Tobacco abuse 09/26/2006  . OBESITY, NOS 07/07/2006  . NEUROPATHY, PERIPHERAL  07/07/2006  . RHINITIS, ALLERGIC 07/07/2006  . IMPOTENCE, ORGANIC 07/07/2006  . INSOMNIA NOS 07/07/2006   Past Medical History:  Diagnosis Date  . Charcot-Marie-Tooth disease   . Charcot-Marie-Tooth disease   . CMT (Charcot-Marie-Tooth disease)   . ED (erectile dysfunction)   . Insomnia   . Seasonal allergies     No family history on file.  No past surgical history on file. Social History   Occupational History  . Not on file  Tobacco Use  . Smoking status: Current Every Day Smoker    Packs/day: 0.10    Types: Cigarettes  . Smokeless tobacco: Never Used  . Tobacco comment: 2-3 cigarettes daily  Substance and Sexual Activity  . Alcohol use: Not on file  . Drug use: Not on file  . Sexual activity: Not on file

## 2019-06-06 ENCOUNTER — Telehealth: Payer: Self-pay | Admitting: Physician Assistant

## 2019-06-06 NOTE — Telephone Encounter (Signed)
Patient called.   Following the conclusion of his appointment yesterday, he needs his return to work date extended on paper   Call back: [phone]

## 2019-06-07 NOTE — Telephone Encounter (Signed)
I tried to call patient several times from listed numbers and no answer. His work note is ready for pickup.

## 2019-06-26 ENCOUNTER — Encounter: Payer: Self-pay | Admitting: Physician Assistant

## 2019-06-26 ENCOUNTER — Other Ambulatory Visit: Payer: Self-pay

## 2019-06-26 ENCOUNTER — Ambulatory Visit (INDEPENDENT_AMBULATORY_CARE_PROVIDER_SITE_OTHER): Payer: BC Managed Care – PPO | Admitting: Physician Assistant

## 2019-06-26 DIAGNOSIS — M1711 Unilateral primary osteoarthritis, right knee: Secondary | ICD-10-CM | POA: Diagnosis not present

## 2019-06-26 MED ORDER — METHYLPREDNISOLONE ACETATE 40 MG/ML IJ SUSP
40.0000 mg | INTRAMUSCULAR | Status: AC | PRN
  Administered 2019-06-26: 40 mg via INTRA_ARTICULAR

## 2019-06-26 MED ORDER — LIDOCAINE HCL 1 % IJ SOLN
5.0000 mL | INTRAMUSCULAR | Status: AC | PRN
  Administered 2019-06-26: 5 mL

## 2019-06-26 NOTE — Progress Notes (Signed)
Office Visit Note   Patient: Aaron Bond           Date of Birth: 02-Jun-1965           MRN: [id number] Visit Date: 06/26/2019              Requested by: Guadalupe Dawn, MD [address],  [place] [address] PCP: Guadalupe Dawn, MD  No chief complaint on file.     HPI: The patient presents for follow up on his left foot fractures at the base of the third and fourth phalanx.  He did with regards to this he is doing quite well.  At his last visit he also was complaining of bilateral knee pain right greater than left.  This is been going on for about 4 months.  He was offered an injection at that time but declined.  He would like to go forward with injecting the right knee today Assessment & Plan: Visit Diagnoses: No diagnosis found.  Plan: He may follow-up as needed for his foot.  With regards to his left knee he can make an appointment at his convenience to have this knee injected.    Follow-Up Instructions: No follow-ups on file.   Ortho Exam  Patient is alert, oriented, no adenopathy, well-dressed, normal affect, normal respiratory effort. Focused examination of his left foot demonstrates no soft tissue swelling pulses intact minimally to nontender to palpation over the forefoot  Bilateral knees tender around the joint line on the right knee greater than the left knee previous x-rays of the right knee did demonstrate early degenerative arthritis especially on the lateral joint of the right knee  Imaging: No results found. No images are attached to the encounter.  Labs: No results found for: HGBA1C, ESRSEDRATE, CRP, LABURIC, REPTSTATUS, GRAMSTAIN, CULT, LABORGA   Lab Results  Component Value Date   ALBUMIN 4.0 05/13/2015   ALBUMIN 4.2 04/19/2011    No results found for: MG No results found for: VD25OH  No results found for: PREALBUMIN CBC EXTENDED Latest Ref Rng & Units 01/09/2018  WBC 3.8 - 10.6 K/uL 4.9  RBC 4.40 - 5.90 MIL/uL 4.83  HGB 13.0 - 18.0  g/dL 14.7  HCT 40.0 - 52.0 % 43.2  PLT 150 - 440 K/uL 233  NEUTROABS 1.4 - 6.5 K/uL 1.6  LYMPHSABS 1.0 - 3.6 K/uL 2.1     There is no height or weight on file to calculate BMI.  Orders:  No orders of the defined types were placed in this encounter.  No orders of the defined types were placed in this encounter.    Procedures: Large Joint Inj: R knee on 06/26/2019 11:04 AM Indications: pain and diagnostic evaluation Details: 22 G 1.5 in needle, anterolateral approach  Arthrogram: No  Medications: 40 mg methylPREDNISolone acetate 40 MG/ML; 5 mL lidocaine 1 % Outcome: tolerated well, no immediate complications Procedure, treatment alternatives, risks and benefits explained, specific risks discussed. Consent was given by the patient. Immediately prior to procedure a time out was called to verify the correct patient, procedure, equipment, support staff and site/side marked as required. Patient was prepped and draped in the usual sterile fashion.      Clinical Data: No additional findings.  ROS:  All other systems negative, except as noted in the HPI. Review of Systems  Objective: Vital Signs: There were no vitals taken for this visit.  Specialty Comments:  No specialty comments available.  PMFS History: Patient Active Problem List   Diagnosis Date  Noted  . Closed nondisplaced fracture of proximal phalanx of lesser toe of left foot 05/22/2019  . Insomnia 02/02/2019  . Essential hypertension, benign 05/15/2015  . Low back pain 12/23/2011  . Tobacco abuse 09/26/2006  . OBESITY, NOS 07/07/2006  . NEUROPATHY, PERIPHERAL 07/07/2006  . RHINITIS, ALLERGIC 07/07/2006  . IMPOTENCE, ORGANIC 07/07/2006  . INSOMNIA NOS 07/07/2006   Past Medical History:  Diagnosis Date  . Charcot-Marie-Tooth disease   . Charcot-Marie-Tooth disease   . CMT (Charcot-Marie-Tooth disease)   . ED (erectile dysfunction)   . Insomnia   . Seasonal allergies     No family history on file.  No  past surgical history on file. Social History   Occupational History  . Not on file  Tobacco Use  . Smoking status: Current Every Day Smoker    Packs/day: 0.10    Types: Cigarettes  . Smokeless tobacco: Never Used  . Tobacco comment: 2-3 cigarettes daily  Substance and Sexual Activity  . Alcohol use: Not on file  . Drug use: Not on file  . Sexual activity: Not on file

## 2019-08-19 ENCOUNTER — Other Ambulatory Visit: Payer: Self-pay | Admitting: Family Medicine

## 2019-10-03 ENCOUNTER — Other Ambulatory Visit: Payer: Self-pay | Admitting: Family Medicine

## 2020-01-23 IMAGING — DX DG FOOT COMPLETE 3+V*L*
3 series · 3 of 3 positions shown · non-contrast
Comparison: None.

CLINICAL DATA: Pain following fall

EXAM:
LEFT FOOT - COMPLETE 3+ VIEW

[foot ap]
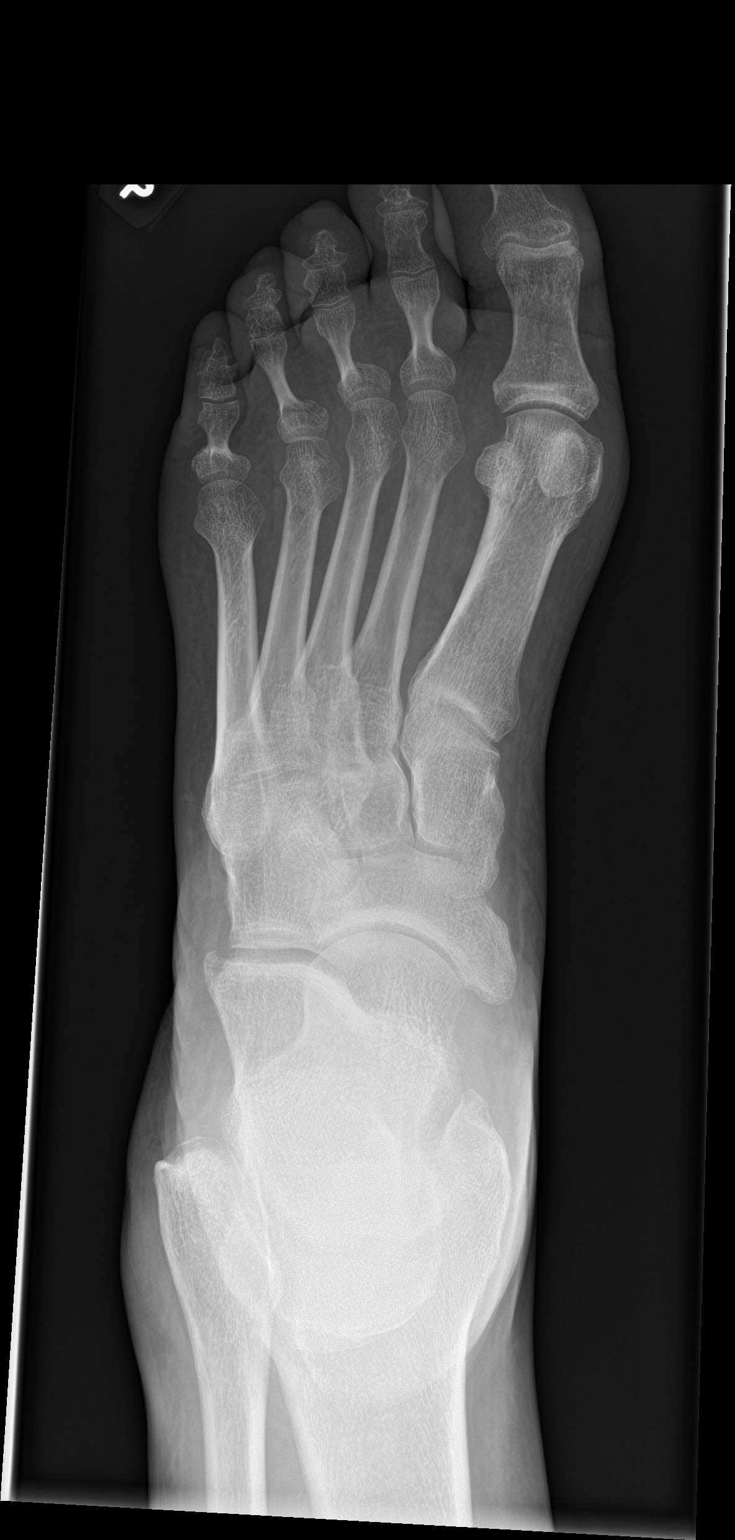

[foot obl]
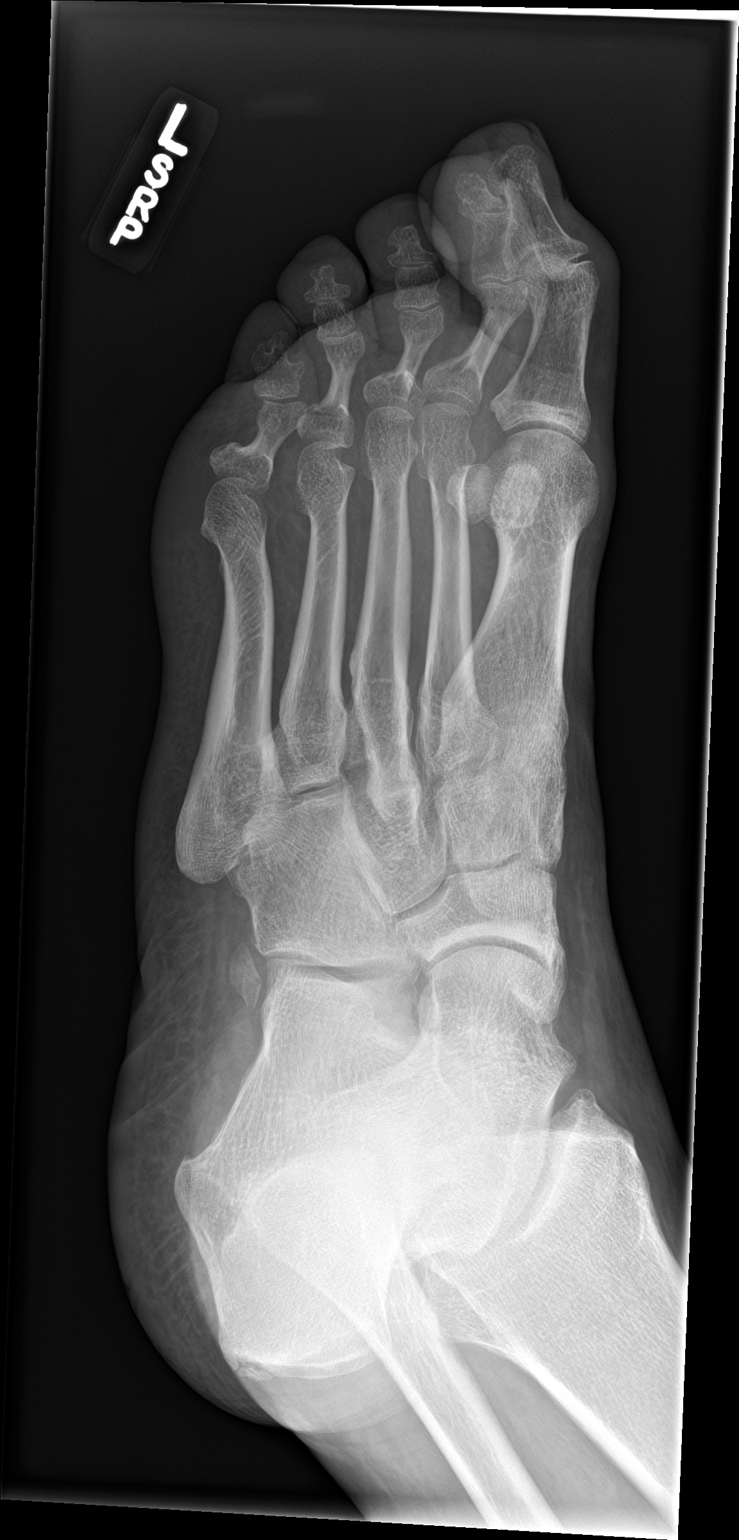

[foot lat]
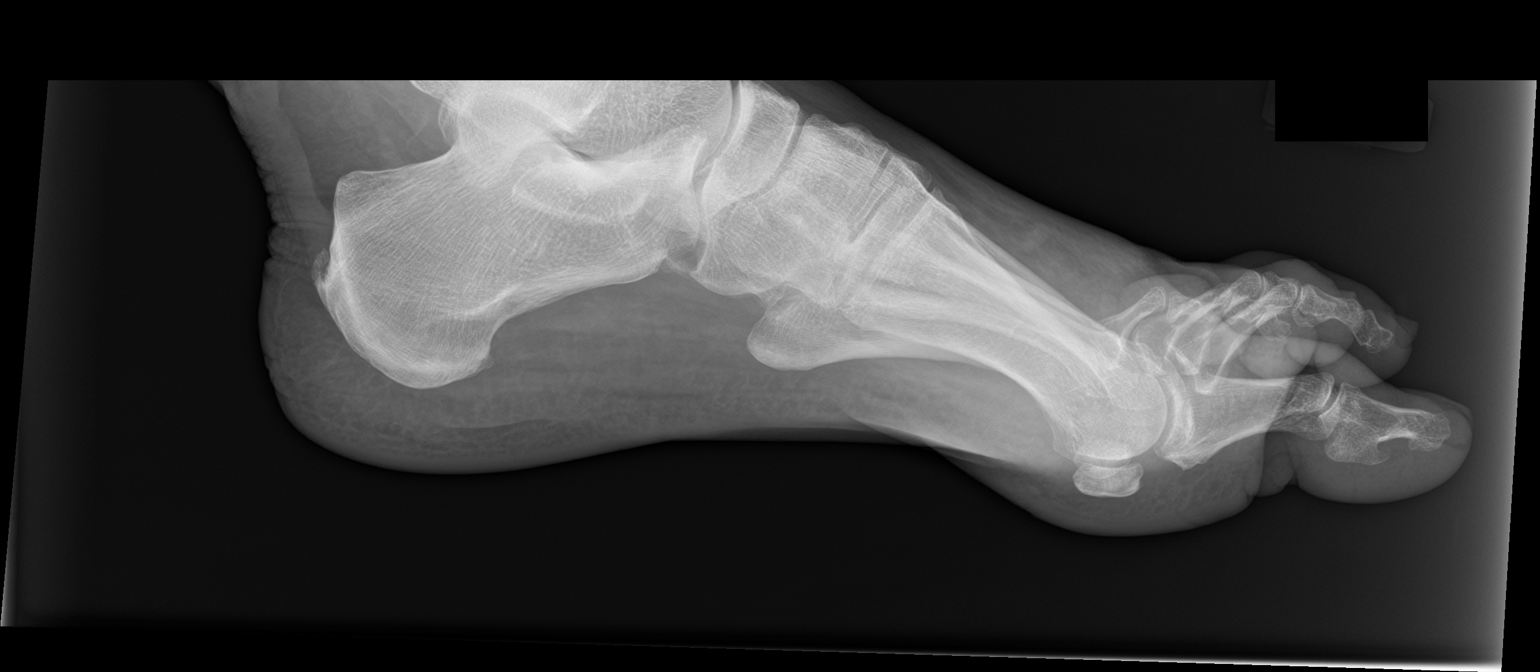

[3 of 3 positions shown; findings below may reference images not displayed]

FINDINGS: Frontal, oblique, and lateral views were obtained. There are
transversely oriented fractures of the proximal aspects of the third
and fourth proximal phalanges. There is mild impaction at the third
proximal phalanx fracture site. No other fractures are evident. No
dislocation. There are flexion deformities of the second, third,
fourth, and fifth PIP joints. No erosive change. No appreciable
joint space narrowing. There is a small posterior calcaneal spur.
IMPRESSION: Fractures of the proximal third and fourth proximal phalanges with
mild impaction at the third proximal phalanx fracture site. No other
fractures. No dislocation. No appreciable joint space narrowing.
Small posterior calcaneal spur noted.

These results will be called to the ordering clinician or
representative by the Radiologist Assistant, and communication
documented in the PACS or zVision Dashboard.

## 2020-02-21 ENCOUNTER — Other Ambulatory Visit: Payer: Self-pay | Admitting: Family Medicine

## 2020-03-05 ENCOUNTER — Other Ambulatory Visit: Payer: Self-pay | Admitting: Family Medicine

## 2020-06-02 ENCOUNTER — Other Ambulatory Visit: Payer: Self-pay | Admitting: Family Medicine

## 2020-07-05 ENCOUNTER — Other Ambulatory Visit: Payer: Self-pay | Admitting: Family Medicine

## 2020-08-02 ENCOUNTER — Other Ambulatory Visit: Payer: Self-pay | Admitting: Family Medicine

## 2020-08-05 NOTE — Telephone Encounter (Signed)
Appointment made.  .Kenyia Wambolt R Benuel Ly, CMA  

## 2020-09-10 ENCOUNTER — Ambulatory Visit: Payer: BC Managed Care – PPO | Admitting: Family Medicine

## 2020-10-02 ENCOUNTER — Ambulatory Visit: Payer: Self-pay | Admitting: Family Medicine

## 2020-11-08 ENCOUNTER — Other Ambulatory Visit: Payer: Self-pay | Admitting: Family Medicine

## 2020-11-14 ENCOUNTER — Ambulatory Visit: Payer: Self-pay | Admitting: Family Medicine

## 2020-11-17 ENCOUNTER — Encounter: Payer: Self-pay | Admitting: Family Medicine

## 2020-11-17 ENCOUNTER — Ambulatory Visit (INDEPENDENT_AMBULATORY_CARE_PROVIDER_SITE_OTHER): Payer: Managed Care, Other (non HMO)

## 2020-11-17 ENCOUNTER — Other Ambulatory Visit: Payer: Self-pay

## 2020-11-17 ENCOUNTER — Ambulatory Visit (INDEPENDENT_AMBULATORY_CARE_PROVIDER_SITE_OTHER): Payer: Managed Care, Other (non HMO) | Admitting: Family Medicine

## 2020-11-17 VITALS — BP 150/90 | HR 78 | Ht 74.0 in | Wt 270.2 lb

## 2020-11-17 DIAGNOSIS — Z23 Encounter for immunization: Secondary | ICD-10-CM | POA: Diagnosis not present

## 2020-11-17 DIAGNOSIS — Z1211 Encounter for screening for malignant neoplasm of colon: Secondary | ICD-10-CM | POA: Diagnosis not present

## 2020-11-17 DIAGNOSIS — Z113 Encounter for screening for infections with a predominantly sexual mode of transmission: Secondary | ICD-10-CM

## 2020-11-17 DIAGNOSIS — G600 Hereditary motor and sensory neuropathy: Secondary | ICD-10-CM

## 2020-11-17 DIAGNOSIS — G4700 Insomnia, unspecified: Secondary | ICD-10-CM | POA: Diagnosis not present

## 2020-11-17 DIAGNOSIS — Z720 Tobacco use: Secondary | ICD-10-CM

## 2020-11-17 DIAGNOSIS — I10 Essential (primary) hypertension: Secondary | ICD-10-CM

## 2020-11-17 MED ORDER — AMLODIPINE BESYLATE 10 MG PO TABS
10.0000 mg | ORAL_TABLET | Freq: Every day | ORAL | 3 refills | Status: DC
Start: 2020-11-17 — End: 2021-10-12

## 2020-11-17 MED ORDER — TRAZODONE HCL 50 MG PO TABS: 25.0000 mg | ORAL_TABLET | Freq: Every evening | ORAL | 0 refills | Status: DC | PRN

## 2020-11-17 MED ORDER — HYDROCHLOROTHIAZIDE 25 MG PO TABS: 25.0000 mg | ORAL_TABLET | Freq: Every day | ORAL | 1 refills | Status: DC

## 2020-11-17 NOTE — Patient Instructions (Addendum)
It was good to see you today.  Thank you for coming in.  Your blood pressure was a little high today.  I am increasing your Norvasc to 10 mg.  If you have any issues with this increase please call and let us know.  I also recommend continuing to cut back on smoking.   I would like to see you back in 1 month to discuss quitting smoking and weight loss strategies.  I reccoemnd on scheduling follow-up with Ortho for your foot.  I placed a referral to GI to discuss getting a colonoscopy.  I am also checking your kidney function, electrolytes, and cholesterol as well as checking an HIV and Hepatitis C.   Be Well, Dr Manus Rudd

## 2020-11-18 DIAGNOSIS — G600 Hereditary motor and sensory neuropathy: Secondary | ICD-10-CM | POA: Insufficient documentation

## 2020-11-18 LAB — BASIC METABOLIC PANEL
BUN/Creatinine Ratio: 17 (ref 9–20)
BUN: 18 mg/dL (ref 6–24)
CO2: 23 mmol/L (ref 20–29)
Calcium: 9.8 mg/dL (ref 8.7–10.2)
Chloride: 101 mmol/L (ref 96–106)
Creatinine, Ser: 1.05 mg/dL (ref 0.76–1.27)
Glucose: 97 mg/dL (ref 65–99)
Potassium: 3.9 mmol/L (ref 3.5–5.2)
Sodium: 143 mmol/L (ref 134–144)
eGFR: 84 mL/min/{1.73_m2} (ref >59)

## 2020-11-18 LAB — HIV ANTIBODY (ROUTINE TESTING W REFLEX): HIV Screen 4th Generation wRfx: NONREACTIVE

## 2020-11-18 LAB — LIPID PANEL
Chol/HDL Ratio: 2.9 ratio (ref 0.0–5.0)
Cholesterol, Total: 121 mg/dL (ref 100–199)
HDL: 42 mg/dL (ref >39)
LDL Chol Calc (NIH): 61 mg/dL (ref 0–99)
Triglycerides: 93 mg/dL (ref 0–149)
VLDL Cholesterol Cal: 18 mg/dL (ref 5–40)

## 2020-11-18 NOTE — Assessment & Plan Note (Signed)
Praised patient for cutting back smoking. - Follow-up in 1 month to further discuss cessation

## 2020-11-18 NOTE — Assessment & Plan Note (Signed)
Increased Amlodipine from 5 to 10 mg given continued BP's in 140's-150's.  Continue HCTZ 25 mg.  - Obtain BMP - Obtain Lipid Panel - Increase Amlodipne to 10 mg - Continue to monitor at home - 1 month follow-up

## 2020-11-18 NOTE — Assessment & Plan Note (Signed)
Continue Trazodone PRN 

## 2020-11-18 NOTE — Progress Notes (Signed)
    SUBJECTIVE:   CHIEF COMPLAINT / HPI: Blood pressure follow-up  Patient indicates has been checking blood pressures at home and SBP's usually run in 140's.  Indicates takes his medications everyday.  Last lab-work performed 2 years ago.  Is currently smoking but has cut down 2-3 cigarettes a day.  Is open to cessation but thinks may be difficult.    Also has Charcot disease of right foot.  Sees Ortho for this.  Currently takes Alleve 2-3 a day for foot pain.  Also has noticed decreased range of motion in big toe.  No issues with walking.  No blood in bowel movement or melena and no abdominal or chest pain.  Takes Trazodone around once a week to help with sleep.  PERTINENT  PMH / PSH: Hypertension, Tobacco Use Disorder  OBJECTIVE:   BP (!) 150/90 Comment: provider informed  Pulse 78   Ht 6\' 2"  (1.88 m)   Wt 270 lb 4 oz (122.6 kg)   BMI 34.70 kg/m    Physical Exam HENT:     Head: Normocephalic and atraumatic.     Mouth/Throat:     Mouth: Mucous membranes are moist.  Cardiovascular:     Rate and Rhythm: Normal rate and regular rhythm.     Pulses: Normal pulses.  Pulmonary:     Effort: Pulmonary effort is normal.     Breath sounds: Normal breath sounds.  Abdominal:     General: Abdomen is flat.     Palpations: Abdomen is soft.  Musculoskeletal:     Right lower leg: No edema.     Left lower leg: No edema.     Right foot: Decreased range of motion. Charcot foot present.     Left foot: Normal range of motion. No Charcot foot.  Feet:     Right foot:     Toenail Condition: Right toenails are normal.     Left foot:     Toenail Condition: Left toenails are normal.     Comments: Weakness in flexion of right big toe Skin:    General: Skin is warm.     Capillary Refill: Capillary refill takes less than 2 seconds.  Neurological:     Mental Status: He is alert.     ASSESSMENT/PLAN:   Essential hypertension, benign Increased Amlodipine from 5 to 10 mg given continued BP's  in 140's-150's.  Continue HCTZ 25 mg.  - Obtain BMP - Obtain Lipid Panel - Increase Amlodipne to 10 mg - Continue to monitor at home - 1 month follow-up  Insomnia Continue Trazodone PRN  Charcot-Marie-Tooth disease Decreased flexion of right main toe on exam.  Has developed over last 6 months. - Schedule follow-up with Ortho given new findings  Tobacco abuse Praised patient for cutting back smoking. - Follow-up in 1 month to further discuss cessation   Healthcare Maintenance Patient open to Colonoscopy. - Obtain HIV and Hep C - Referral to GI for colonoscopy  Delora Fuel, MD [place]

## 2020-11-18 NOTE — Assessment & Plan Note (Signed)
Decreased flexion of right main toe on exam.  Has developed over last 6 months. - Schedule follow-up with Ortho given new findings

## 2021-02-02 ENCOUNTER — Other Ambulatory Visit: Payer: Self-pay

## 2021-02-02 ENCOUNTER — Ambulatory Visit: Payer: Self-pay

## 2021-02-02 ENCOUNTER — Ambulatory Visit (INDEPENDENT_AMBULATORY_CARE_PROVIDER_SITE_OTHER): Payer: Managed Care, Other (non HMO) | Admitting: Orthopedic Surgery

## 2021-02-02 DIAGNOSIS — M79671 Pain in right foot: Secondary | ICD-10-CM | POA: Diagnosis not present

## 2021-02-02 DIAGNOSIS — M79672 Pain in left foot: Secondary | ICD-10-CM | POA: Diagnosis not present

## 2021-02-02 DIAGNOSIS — M6701 Short Achilles tendon (acquired), right ankle: Secondary | ICD-10-CM | POA: Diagnosis not present

## 2021-02-02 DIAGNOSIS — M6702 Short Achilles tendon (acquired), left ankle: Secondary | ICD-10-CM

## 2021-03-02 ENCOUNTER — Ambulatory Visit: Payer: Managed Care, Other (non HMO) | Admitting: Orthopedic Surgery

## 2021-03-03 ENCOUNTER — Encounter: Payer: Self-pay | Admitting: Orthopedic Surgery

## 2021-03-03 NOTE — Progress Notes (Signed)
Office Visit Note   Patient: Aaron Bond           Date of Birth: 24-Apr-1966           MRN: [id number] Visit Date: 02/02/2021              Requested by: Erskine Emery, MD [address] [place],  [place] [address] PCP: Erskine Emery, MD  Chief Complaint  Patient presents with   Left Ankle - Edema   Right Foot - Pain   Left Foot - Pain      HPI: Patient is a 55 year old gentleman who presents complaining of bilateral foot pain.  He states he has right foot pain under the first metatarsal head of the great toe he states the left foot hurts entirely.  He is status post metatarsal fracture.  [facility]  Patient states that he also had a twisting injury to his left ankle several months ago with persistent swelling denies any pain.  Patient has a history of Charcot-Marie-Tooth with a cavovarus foot.  Assessment & Plan: Visit Diagnoses:  1. Bilateral foot pain   2. Achilles tendon contracture, bilateral     Plan: Recommended Achilles stretching this was demonstrated.  Patient may require pressure offloading from the first ray to allow his foot to get out of varus.  Follow-Up Instructions: Return in about 4 weeks (around 03/02/2021).   Ortho Exam  Patient is alert, oriented, no adenopathy, well-dressed, normal affect, normal respiratory effort. Examination the feet have dorsiflexion short of neutral by 20 degrees with the knee extended.  With the knee bent the dorsiflexion is to neutral.  The left ankle has tenderness over the anterior talofibular ligament consistent with a sprain.  Patient has decreased subtalar motion with hindfoot varus secondary to the Charcot arthropathy.  He has a plantarflexed first ray with pain beneath the fibular sesamoid on the right.  Patient with his cavovarus foot plantarflexed first ray is overloading the medial column and this is made worse by the Achilles contracture.  Imaging: No results found. No images are  attached to the encounter.  Labs: No results found for: HGBA1C, ESRSEDRATE, CRP, LABURIC, REPTSTATUS, GRAMSTAIN, CULT, LABORGA   Lab Results  Component Value Date   ALBUMIN 4.0 05/13/2015   ALBUMIN 4.2 04/19/2011    No results found for: MG No results found for: VD25OH  No results found for: PREALBUMIN CBC EXTENDED Latest Ref Rng & Units 01/09/2018  WBC 3.8 - 10.6 K/uL 4.9  RBC 4.40 - 5.90 MIL/uL 4.83  HGB 13.0 - 18.0 g/dL 14.7  HCT 40.0 - 52.0 % 43.2  PLT 150 - 440 K/uL 233  NEUTROABS 1.4 - 6.5 K/uL 1.6  LYMPHSABS 1.0 - 3.6 K/uL 2.1     There is no height or weight on file to calculate BMI.  Orders:  Orders Placed This Encounter  Procedures   XR Foot 2 Views Right   XR Foot 2 Views Left   No orders of the defined types were placed in this encounter.    Procedures: No procedures performed  Clinical Data: No additional findings.  ROS:  All other systems negative, except as noted in the HPI. Review of Systems  Objective: Vital Signs: There were no vitals taken for this visit.  Specialty Comments:  No specialty comments available.  PMFS History: Patient Active Problem List   Diagnosis Date Noted   Charcot-Marie-Tooth disease 11/18/2020   Closed nondisplaced fracture of proximal phalanx of  lesser toe of left foot 05/22/2019   Insomnia 02/02/2019   Essential hypertension, benign 05/15/2015   Low back pain 12/23/2011   Tobacco abuse 09/26/2006   OBESITY, NOS 07/07/2006   NEUROPATHY, PERIPHERAL 07/07/2006   RHINITIS, ALLERGIC 07/07/2006   IMPOTENCE, ORGANIC 07/07/2006   INSOMNIA NOS 07/07/2006   Past Medical History:  Diagnosis Date   Charcot-Marie-Tooth disease    Charcot-Marie-Tooth disease    CMT (Charcot-Marie-Tooth disease)    ED (erectile dysfunction)    Insomnia    Seasonal allergies     No family history on file.  No past surgical history on file. Social History   Occupational History   Not on file  Tobacco Use   Smoking status:  Every Day    Packs/day: 0.10    Types: Cigarettes   Smokeless tobacco: Never   Tobacco comments:    2-3 cigarettes daily  Substance and Sexual Activity   Alcohol use: Not on file   Drug use: Not on file   Sexual activity: Not on file

## 2021-03-19 ENCOUNTER — Encounter: Payer: Self-pay | Admitting: Internal Medicine

## 2021-03-30 ENCOUNTER — Other Ambulatory Visit: Payer: Self-pay

## 2021-03-30 ENCOUNTER — Encounter: Payer: Self-pay | Admitting: Student

## 2021-03-30 ENCOUNTER — Ambulatory Visit (INDEPENDENT_AMBULATORY_CARE_PROVIDER_SITE_OTHER): Payer: Managed Care, Other (non HMO)

## 2021-03-30 ENCOUNTER — Ambulatory Visit (INDEPENDENT_AMBULATORY_CARE_PROVIDER_SITE_OTHER): Payer: Managed Care, Other (non HMO) | Admitting: Student

## 2021-03-30 VITALS — BP 149/100 | HR 78 | Ht 74.0 in | Wt 279.1 lb

## 2021-03-30 DIAGNOSIS — Z23 Encounter for immunization: Secondary | ICD-10-CM | POA: Diagnosis not present

## 2021-03-30 DIAGNOSIS — N522 Drug-induced erectile dysfunction: Secondary | ICD-10-CM

## 2021-03-30 DIAGNOSIS — Z0000 Encounter for general adult medical examination without abnormal findings: Secondary | ICD-10-CM | POA: Diagnosis not present

## 2021-03-30 DIAGNOSIS — G600 Hereditary motor and sensory neuropathy: Secondary | ICD-10-CM

## 2021-03-30 DIAGNOSIS — I10 Essential (primary) hypertension: Secondary | ICD-10-CM

## 2021-03-30 DIAGNOSIS — Z720 Tobacco use: Secondary | ICD-10-CM

## 2021-03-30 MED ORDER — SILDENAFIL CITRATE 100 MG PO TABS
50.0000 mg | ORAL_TABLET | Freq: Every day | ORAL | 11 refills | Status: DC | PRN
Start: 2021-03-30 — End: 2022-04-14

## 2021-03-30 MED ORDER — LOSARTAN POTASSIUM-HCTZ 50-12.5 MG PO TABS: 1.0000 | ORAL_TABLET | Freq: Every day | ORAL | 1 refills | Status: DC

## 2021-03-30 NOTE — Assessment & Plan Note (Signed)
Discussed quitting smoking with the patient. He would like to quit, is smoking 1-2 cigarettes a day.

## 2021-03-30 NOTE — Assessment & Plan Note (Signed)
Pt is requesting orthotic referral, I have placed this upon request.

## 2021-03-30 NOTE — Patient Instructions (Signed)
It was a pleasure to see you today! Thank you for choosing [facility] for your primary care.   Our plans for today were: Referral to orthotics placed  Order for Viagra given  New HTN medication ordered  Please bring your cuff to your next appt and continue to keep a record at home over the next 3-4 weeks   You should return to our clinic to see [place] in 3-4 weeks for blood pressure follow up.   Best,  Erskine Emery

## 2021-03-30 NOTE — Assessment & Plan Note (Signed)
He has been experiencing ED likely due to a variety of causes in addition to blood pressure medication. We have decreased the dosage of his HCTZ and added losartan. Additionally, I ordered Viagra for the pt as well to use as needed. Smoking cessation will also benefit him with this problem. Follow up in 3-4 weeks.

## 2021-03-30 NOTE — Assessment & Plan Note (Signed)
Flu and COVID given today. He still needs Hep C. He has a GI referral placed, needs to schedule.

## 2021-03-30 NOTE — Progress Notes (Signed)
    SUBJECTIVE:   CHIEF COMPLAINT / HPI:   HTN: Current BP regimen: HCTZ 25 mg and Amlodipine 10mg , takes everyday only misses one dose a month.  He is not exercising 2/2 to pain in his feet and  is somewhat  adherent to low salt diet, does not eat much salt and only eats fried foods on occasion.  Blood pressure  is  well controlled at home, 135-138/85-95. He checks it with his cuff at home regularly. Cardiac symptoms none. Patient denies chest pain, chest pressure/discomfort, dyspnea, fatigue, and palpitations.  Cardiovascular risk factors: advanced age (older than 26 for men, 35 for women), hypertension, male gender, sedentary lifestyle, and smoking/ tobacco exposure. Takes 2-3 Aleve a day. History of target organ damage:  None .  Healthcare Maintenance: In need of colonoscopy, Hep C, flu, COVID, TDAP.   Charcot-Marie-Tooth: Needs orthotics referral. He has been using orthotics from [facility].   Erectile Dysfunction: Patient complains of erectile dysfunction.  Onset of dysfunction was 3 month ago and onset 6 months.  Patient states the nature of difficulty is maintaining erection. Full erections occur some. Risk factors for ED include antihypertensive medications, started after he was prescribed HCTZ 25mg . He has previously experienced ED around 15 years ago, but none since until recently.    PERTINENT  PMH / PSH:   Patient Active Problem List   Diagnosis Date Noted   Healthcare maintenance 03/30/2021   Charcot-Marie-Tooth disease 11/18/2020   Closed nondisplaced fracture of proximal phalanx of lesser toe of left foot 05/22/2019   Insomnia 02/02/2019   Essential hypertension, benign 05/15/2015   Low back pain 12/23/2011   Tobacco abuse 09/26/2006   OBESITY, NOS 07/07/2006   NEUROPATHY, PERIPHERAL 07/07/2006   RHINITIS, ALLERGIC 07/07/2006   Erectile dysfunction 07/07/2006   INSOMNIA NOS 07/07/2006     OBJECTIVE:   BP (!) 149/100   Pulse 78   Ht 6\' 2"  (1.88 m)   Wt 279 lb  2 oz (126.6 kg)   SpO2 100%   BMI 35.84 kg/m   General: Alert and oriented in no apparent distress Heart: Regular rate and rhythm with no murmurs appreciated Lungs: CTA bilaterally, no wheezing Abdomen: Bowel sounds present, no abdominal pain Skin: Warm and dry Extremities: No lower extremity edema   ASSESSMENT/PLAN:   Tobacco abuse Discussed quitting smoking with the patient. He would like to quit, is smoking 1-2 cigarettes a day.   Essential hypertension, benign D/c HCTZ 25 mg. We started Losartan-HCTZ instead in addition to his amlodipine 10 mg. He is to record his Bps over the next 3-4 weeks  And bring his cuff on next visit. Work on exercise and decreasing salt intake as well as smoking cessation.   Erectile dysfunction He has been experiencing ED likely due to a variety of causes in addition to blood pressure medication. We have decreased the dosage of his HCTZ and added losartan. Additionally, I ordered Viagra for the pt as well to use as needed. Smoking cessation will also benefit him with this problem. Follow up in 3-4 weeks.   Healthcare maintenance Flu and COVID given today. He still needs Hep C. He has a GI referral placed, needs to schedule.   Charcot-Marie-Tooth disease Pt is requesting orthotic referral, I have placed this upon request.      Erskine Emery, MD [place]

## 2021-03-30 NOTE — Assessment & Plan Note (Signed)
D/c HCTZ 25 mg. We started Losartan-HCTZ instead in addition to his amlodipine 10 mg. He is to record his Bps over the next 3-4 weeks  And bring his cuff on next visit. Work on exercise and decreasing salt intake as well as smoking cessation.

## 2021-04-10 ENCOUNTER — Ambulatory Visit (INDEPENDENT_AMBULATORY_CARE_PROVIDER_SITE_OTHER): Payer: Managed Care, Other (non HMO) | Admitting: Sports Medicine

## 2021-04-10 VITALS — BP 132/92 | Ht 74.0 in | Wt 270.0 lb

## 2021-04-10 DIAGNOSIS — M25579 Pain in unspecified ankle and joints of unspecified foot: Secondary | ICD-10-CM | POA: Diagnosis not present

## 2021-04-10 NOTE — Progress Notes (Signed)
   SUBJECTIVE:   CHIEF COMPLAINT: Foot pain  Very pleasant 55 year old male with history of Charcot-Marie-Tooth and known Achilles tendon contracture comes in today to discuss possible orthotics.  He is a patient of Dr. Jess Barters.  He is trying to avoid surgery if possible.  This is a chronic issue for him.  He has had custom orthotics in the past.  OBJECTIVE:  Well-developed, well-nourished.  Examination of the patient's feet show a cavovarus foot bilaterally.  No soft tissue swelling.  Limited range of motion but good pulses.  ASSESSMENT/PLAN:   Chronic cavovarus foot deformity  I agree that the patient would likely benefit from custom orthotics.  He will return to the office next week to have these constructed.  We will plan on adding a first ray post to help try to offload some of the pressure over the medial foot.    Patient seen and evaluated with the resident.  I agree with the above plan of care.

## 2021-04-10 NOTE — Patient Instructions (Signed)
It was great seeing you today!   As discusssed, plan to get your orthopedic inserts on Wednesday morning at 9 AM. Bring your shoes and socks with you.     Take care,   [place]

## 2021-04-15 ENCOUNTER — Ambulatory Visit (INDEPENDENT_AMBULATORY_CARE_PROVIDER_SITE_OTHER): Payer: Managed Care, Other (non HMO) | Admitting: Sports Medicine

## 2021-04-15 VITALS — Ht 74.0 in | Wt 270.0 lb

## 2021-04-15 DIAGNOSIS — M25579 Pain in unspecified ankle and joints of unspecified foot: Secondary | ICD-10-CM

## 2021-04-15 NOTE — Progress Notes (Signed)
Patient ID: Aaron Bond, male   DOB: 09-19-1965, 55 y.o.   MRN: [id number]  Rhythm presents today for custom orthotics.  Please see the office note from December 2 for details regarding history and physical exam findings.  In short, this patient has bilateral chronic foot pain secondary to Charcot-Marie-Tooth.  He was referred to [place] by Dr. Sharol Given for possible custom orthotics.  He is experiencing pain in the area of the fibular sesamoid on the right foot.  Also some lateral foot pain on the left.  Examination of both feet show a cavovarus foot bilaterally.  He has callus formation over the fibular sesamoid on the plantar aspect of the right foot.  It is minimally tender to palpation.  He also has a small palpable tailor's bunion on the left. Gait analysis shows a supinated gait on the right but a fairly neutral gait on the left.  Custom orthotics were created as below.  The dancers pad on the right orthotic was comfortable and offloading pressure over the fibular sesamoid.  He will return to the office in approximately 4 weeks for reevaluation.  If he is still experiencing pain in the left foot from his tailor's bunion that I would consider adding a metatarsal pad to that orthotic.  He is encouraged to call with questions or concerns prior to his follow-up visit.  Hopefully these will be of benefit to him.  Patient was fitted for a : standard, cushioned, semi-rigid orthotic. The orthotic was heated and afterward the patient stood on the orthotic blank positioned on the orthotic stand. The patient was positioned in subtalar neutral position and 10 degrees of ankle dorsiflexion in a weight bearing stance. After completion of molding, a stable base was applied to the orthotic blank. The blank was ground to a stable position for weight bearing. Size: 13 Base: Blue EVA Posting: none Additional orthotic padding: Dancers pad and lateral heel wedge on the right orthotic.  This note was dictated using Dragon  naturally speaking software and may contain errors in syntax, spelling, or content which have not been identified prior to signing this note.

## 2021-04-22 ENCOUNTER — Other Ambulatory Visit: Payer: Self-pay | Admitting: Student

## 2021-04-22 DIAGNOSIS — I10 Essential (primary) hypertension: Secondary | ICD-10-CM

## 2021-04-29 ENCOUNTER — Ambulatory Visit: Payer: Managed Care, Other (non HMO) | Admitting: Student

## 2021-05-12 ENCOUNTER — Ambulatory Visit (AMBULATORY_SURGERY_CENTER): Payer: Managed Care, Other (non HMO) | Admitting: *Deleted

## 2021-05-12 ENCOUNTER — Other Ambulatory Visit: Payer: Self-pay

## 2021-05-12 VITALS — Ht 74.0 in | Wt 270.0 lb

## 2021-05-12 DIAGNOSIS — Z1211 Encounter for screening for malignant neoplasm of colon: Secondary | ICD-10-CM

## 2021-05-12 MED ORDER — NA SULFATE-K SULFATE-MG SULF 17.5-3.13-1.6 GM/177ML PO SOLN: 1.0000 | Freq: Once | ORAL | 0 refills | Status: AC

## 2021-05-12 NOTE — Progress Notes (Signed)
No egg or soy allergy known to patient  No issues known to pt with past sedation with any surgeries or procedures Patient denies ever being told they had issues or difficulty with intubation  No FH of Malignant Hyperthermia Pt is not on diet pills Pt is not on  home 02  Pt is not on blood thinners  Pt denies issues with constipation  No A fib or A flutter  Pt is fully vaccinated  for Covid   Discussed with pt there will be an out-of-pocket cost for prep and that varies from $0 to 70 +  dollars - pt verbalized understanding   Due to the COVID-19 pandemic we are asking patients to follow certain guidelines in [place] and the [place]   Pt aware of COVID protocols and [facility] guidelines

## 2021-05-19 ENCOUNTER — Encounter: Payer: Self-pay | Admitting: Internal Medicine

## 2021-05-26 ENCOUNTER — Other Ambulatory Visit: Payer: Self-pay

## 2021-05-26 ENCOUNTER — Encounter: Payer: Self-pay | Admitting: Internal Medicine

## 2021-05-26 ENCOUNTER — Ambulatory Visit (AMBULATORY_SURGERY_CENTER): Payer: Managed Care, Other (non HMO) | Admitting: Internal Medicine

## 2021-05-26 VITALS — BP 121/78 | HR 58 | Temp 98.4°F | Resp 16 | Ht 74.0 in | Wt 270.0 lb

## 2021-05-26 DIAGNOSIS — D123 Benign neoplasm of transverse colon: Secondary | ICD-10-CM

## 2021-05-26 DIAGNOSIS — Z1211 Encounter for screening for malignant neoplasm of colon: Secondary | ICD-10-CM

## 2021-05-26 MED ORDER — SODIUM CHLORIDE 0.9 % IV SOLN: 500.0000 mL | Freq: Once | INTRAVENOUS | Status: DC

## 2021-05-26 NOTE — Progress Notes (Signed)
Called to room to assist during endoscopic procedure.  Patient ID and intended procedure confirmed with present staff. Received instructions for my participation in the procedure from the performing physician.  

## 2021-05-26 NOTE — Progress Notes (Signed)
GASTROENTEROLOGY PROCEDURE H&P NOTE   Primary Care Physician: Erskine Emery, MD    Reason for Procedure:   Colon cancer screening  Plan:    Colonoscopy  Patient is appropriate for endoscopic procedure(s) in the ambulatory ([place]) setting.  The nature of the procedure, as well as the risks, benefits, and alternatives were carefully and thoroughly reviewed with the patient. Ample time for discussion and questions allowed. The patient understood, was satisfied, and agreed to proceed.     HPI: Aaron Bond is a 56 y.o. male who presents for colonoscopy for colon cancer screening. Denies blood in stools, changes in bowel habits, weight loss. Denies fam hx of colon cancer.  Past Medical History:  Diagnosis Date   Charcot-Marie-Tooth disease    Charcot-Marie-Tooth disease    CMT (Charcot-Marie-Tooth disease)    ED (erectile dysfunction)    Hypertension    Insomnia    Seasonal allergies     Past Surgical History:  Procedure Laterality Date   NO PAST SURGERIES      Prior to Admission medications   Medication Sig Start Date End Date Taking? Authorizing Provider  amLODipine (NORVASC) 10 MG tablet Take 1 tablet (10 mg total) by mouth at bedtime. 11/17/20  Yes Maness, Arnette Norris, MD  losartan-hydrochlorothiazide (HYZAAR) 50-12.5 MG tablet Take 1 tablet by mouth daily. 03/30/21  Yes Erskine Emery, MD  sildenafil (VIAGRA) 100 MG tablet Take 0.5 tablets (50 mg total) by mouth daily as needed for erectile dysfunction (ORALLY 1 hour (range 0.5 to 4 hours) prior to sexual activity; MAX frequency of administration once dail). 03/30/21   Erskine Emery, MD  traZODone (DESYREL) 50 MG tablet Take 0.5-1 tablets (25-50 mg total) by mouth at bedtime as needed for sleep. 11/17/20   Maness, Arnette Norris, MD    Current Outpatient Medications  Medication Sig Dispense Refill   amLODipine (NORVASC) 10 MG tablet Take 1 tablet (10 mg total) by mouth at bedtime. 90 tablet 3   losartan-hydrochlorothiazide  (HYZAAR) 50-12.5 MG tablet Take 1 tablet by mouth daily. 30 tablet 1   sildenafil (VIAGRA) 100 MG tablet Take 0.5 tablets (50 mg total) by mouth daily as needed for erectile dysfunction (ORALLY 1 hour (range 0.5 to 4 hours) prior to sexual activity; MAX frequency of administration once dail). 5 tablet 11   traZODone (DESYREL) 50 MG tablet Take 0.5-1 tablets (25-50 mg total) by mouth at bedtime as needed for sleep. 90 tablet 0   Current Facility-Administered Medications  Medication Dose Route Frequency Provider Last Rate Last Admin   0.9 %  sodium chloride infusion  500 mL Intravenous Once Sharyn Creamer, MD        Allergies as of 05/26/2021   (No Known Allergies)    Family History  Problem Relation Age of Onset   Colon cancer Neg Hx    Colon polyps Neg Hx    Esophageal cancer Neg Hx    Rectal cancer Neg Hx    Stomach cancer Neg Hx     Social History   Socioeconomic History   Marital status: Married    Spouse name: Not on file   Number of children: Not on file   Years of education: Not on file   Highest education level: Not on file  Occupational History   Not on file  Tobacco Use   Smoking status: Every Day    Packs/day: 0.10    Types: Cigarettes   Smokeless tobacco: Never   Tobacco comments:    2-3 cigarettes daily  Vaping Use   Vaping Use: Never used  Substance and Sexual Activity   Alcohol use: Not on file    Comment: occ   Drug use: Yes    Types: Marijuana    Comment: in 20"s   Sexual activity: Not on file  Other Topics Concern   Not on file  Social History Narrative   Not on file   Social Determinants of Health   Financial Resource Strain: Not on file  Food Insecurity: Not on file  Transportation Needs: Not on file  Physical Activity: Not on file  Stress: Not on file  Social Connections: Not on file  Intimate Partner Violence: Not on file    Physical Exam: Vital signs in last 24 hours: BP (!) 145/80    Pulse 78    Temp 98.4 F (36.9 C) (Temporal)     Ht 6\' 2"  (1.88 m)    Wt 270 lb (122.5 kg)    SpO2 96%    BMI 34.67 kg/m  GEN: NAD EYE: Sclerae anicteric ENT: MMM CV: Non-tachycardic Pulm: No increased work of breathing GI: Soft, NT/ND NEURO:  Alert & Oriented   Christia Reading, MD [facility]  05/26/2021 11:47 AM

## 2021-05-26 NOTE — Patient Instructions (Signed)
Resume previous diet and medications. °Awaiting pathology results. Repeat Colonoscopy date to be determined based on pathology results. ° °YOU HAD AN ENDOSCOPIC PROCEDURE TODAY AT THE [facility]:   Refer to the procedure report that was given to you for any specific questions about what was found during the examination.  If the procedure report does not answer your questions, please call your gastroenterologist to clarify.  If you requested that your care partner not be given the details of your procedure findings, then the procedure report has been included in a sealed envelope for you to review at your convenience later. ° °YOU SHOULD EXPECT: Some feelings of bloating in the abdomen. Passage of more gas than usual.  Walking can help get rid of the air that was put into your GI tract during the procedure and reduce the bloating. If you had a lower endoscopy (such as a colonoscopy or flexible sigmoidoscopy) you may notice spotting of blood in your stool or on the toilet paper. If you underwent a bowel prep for your procedure, you may not have a normal bowel movement for a few days. ° °Please Note:  You might notice some irritation and congestion in your nose or some drainage.  This is from the oxygen used during your procedure.  There is no need for concern and it should clear up in a day or so. ° °SYMPTOMS TO REPORT IMMEDIATELY: ° °Following lower endoscopy (colonoscopy or flexible sigmoidoscopy): ° Excessive amounts of blood in the stool ° Significant tenderness or worsening of abdominal pains ° Swelling of the abdomen that is new, acute ° Fever of 100°F or higher ° °For urgent or emergent issues, a gastroenterologist can be reached at any hour by calling [phone]. °Do not use MyChart messaging for urgent concerns.  ° ° °DIET:  We do recommend a small meal at first, but then you may proceed to your regular diet.  Drink plenty of fluids but you should avoid alcoholic beverages for 24  hours. ° °ACTIVITY:  You should plan to take it easy for the rest of today and you should NOT DRIVE or use heavy machinery until tomorrow (because of the sedation medicines used during the test).   ° °FOLLOW UP: °Our staff will call the number listed on your records 48-72 hours following your procedure to check on you and address any questions or concerns that you may have regarding the information given to you following your procedure. If we do not reach you, we will leave a message.  We will attempt to reach you two times.  During this call, we will ask if you have developed any symptoms of COVID 19. If you develop any symptoms (ie: fever, flu-like symptoms, shortness of breath, cough etc.) before then, please call [phone].  If you test positive for Covid 19 in the 2 weeks post procedure, please call and report this information to us.   ° °If any biopsies were taken you will be contacted by phone or by letter within the next 1-3 weeks.  Please call us at [phone] if you have not heard about the biopsies in 3 weeks.  ° ° °SIGNATURES/CONFIDENTIALITY: °You and/or your care partner have signed paperwork which will be entered into your electronic medical record.  These signatures attest to the fact that that the information above on your After Visit Summary has been reviewed and is understood.  Full responsibility of the confidentiality of this discharge information lies with you and/or your care-partner.  °

## 2021-05-26 NOTE — Progress Notes (Signed)
A and O x3. Report to RN. Tolerated MAC anesthesia well.

## 2021-05-26 NOTE — Op Note (Signed)
[place] Patient Name: Eri Platten Procedure Date: 05/26/2021 11:52 AM MRN: [id number] Endoscopist: Sonny Masters "Christia Reading ,  Age: 56 Referring MD:  Date of Birth: 01-17-1966 Gender: Male Account #: [id number] Procedure:                Colonoscopy Indications:              Screening for colorectal malignant neoplasm, This                            is the patient's first colonoscopy Medicines:                Monitored Anesthesia Care Procedure:                Pre-Anesthesia Assessment:                           - Prior to the procedure, a History and Physical                            was performed, and patient medications and                            allergies were reviewed. The patient's tolerance of                            previous anesthesia was also reviewed. The risks                            and benefits of the procedure and the sedation                            options and risks were discussed with the patient.                            All questions were answered, and informed consent                            was obtained. Prior Anticoagulants: The patient has                            taken no previous anticoagulant or antiplatelet                            agents. ASA Grade Assessment: II - A patient with                            mild systemic disease. After reviewing the risks                            and benefits, the patient was deemed in                            satisfactory condition to undergo the procedure.  After obtaining informed consent, the colonoscope                            was passed under direct vision. Throughout the                            procedure, the patient's blood pressure, pulse, and                            oxygen saturations were monitored continuously. The                            Olympus Colonoscope #[id number] was introduced through                            the anus and  advanced to the the terminal ileum.                            The colonoscopy was performed without difficulty.                            The patient tolerated the procedure well. The                            quality of the bowel preparation was good. The                            terminal ileum, ileocecal valve, appendiceal                            orifice, and rectum were photographed. Scope In: 11:57:28 AM Scope Out: 12:18:53 PM Scope Withdrawal Time: 0 hours 11 minutes 34 seconds  Total Procedure Duration: 0 hours 21 minutes 25 seconds  Findings:                 The terminal ileum appeared normal.                           A 2 mm polyp was found in the transverse colon. The                            polyp was sessile. The polyp was removed with a                            cold biopsy forceps. Resection and retrieval were                            complete.                           Non-bleeding internal hemorrhoids were found during                            retroflexion. Complications:  No immediate complications. Estimated Blood Loss:     Estimated blood loss was minimal. Impression:               - The examined portion of the ileum was normal.                           - One 2 mm polyp in the transverse colon, removed                            with a cold biopsy forceps. Resected and retrieved.                           - Non-bleeding internal hemorrhoids. Recommendation:           - Discharge patient to home (with escort).                           - Await pathology results.                           - The findings and recommendations were discussed                            with the patient. Sonny Masters "Christia Reading,  05/26/2021 12:21:29 PM

## 2021-05-26 NOTE — Progress Notes (Signed)
Pt's states no medical or surgical changes since previsit or office visit. 

## 2021-05-28 ENCOUNTER — Encounter: Payer: Self-pay | Admitting: Internal Medicine

## 2021-05-28 ENCOUNTER — Telehealth: Payer: Self-pay

## 2021-05-28 NOTE — Telephone Encounter (Signed)
°  Follow up Call-  Call back number 05/26/2021  Post procedure Call Back phone  # [phone]  Permission to leave phone message Yes  Some recent data might be hidden     Patient questions:  Do you have a fever, pain , or abdominal swelling? No. Pain Score  0 *  Have you tolerated food without any problems? Yes.    Have you been able to return to your normal activities? Yes.    Do you have any questions about your discharge instructions: Diet   No. Medications  No. Follow up visit  No.  Do you have questions or concerns about your Care? No.  Actions: * If pain score is 4 or above: No action needed, pain <4.

## 2021-05-29 ENCOUNTER — Other Ambulatory Visit: Payer: Self-pay

## 2021-05-30 MED ORDER — TRAZODONE HCL 50 MG PO TABS: 25.0000 mg | ORAL_TABLET | Freq: Every evening | ORAL | 0 refills | Status: DC | PRN

## 2021-06-10 ENCOUNTER — Other Ambulatory Visit: Payer: Self-pay | Admitting: Student

## 2021-06-10 DIAGNOSIS — I10 Essential (primary) hypertension: Secondary | ICD-10-CM

## 2021-07-04 ENCOUNTER — Other Ambulatory Visit: Payer: Self-pay | Admitting: Student

## 2021-07-04 DIAGNOSIS — I10 Essential (primary) hypertension: Secondary | ICD-10-CM

## 2021-08-05 ENCOUNTER — Other Ambulatory Visit: Payer: Self-pay | Admitting: Student

## 2021-09-01 ENCOUNTER — Other Ambulatory Visit: Payer: Self-pay | Admitting: Student

## 2021-09-01 DIAGNOSIS — I10 Essential (primary) hypertension: Secondary | ICD-10-CM

## 2021-09-26 ENCOUNTER — Other Ambulatory Visit: Payer: Self-pay | Admitting: Student

## 2021-09-26 DIAGNOSIS — I10 Essential (primary) hypertension: Secondary | ICD-10-CM

## 2021-10-03 ENCOUNTER — Other Ambulatory Visit: Payer: Self-pay | Admitting: Student

## 2021-10-06 NOTE — Telephone Encounter (Signed)
Need f/u before next refill

## 2021-10-12 ENCOUNTER — Other Ambulatory Visit: Payer: Self-pay

## 2021-10-12 MED ORDER — AMLODIPINE BESYLATE 10 MG PO TABS: 10.0000 mg | ORAL_TABLET | Freq: Every day | ORAL | 3 refills | Status: DC

## 2021-10-13 ENCOUNTER — Encounter: Payer: Self-pay | Admitting: *Deleted

## 2021-11-03 ENCOUNTER — Other Ambulatory Visit: Payer: Self-pay | Admitting: Student

## 2021-11-03 DIAGNOSIS — I10 Essential (primary) hypertension: Secondary | ICD-10-CM

## 2021-12-07 ENCOUNTER — Other Ambulatory Visit: Payer: Self-pay | Admitting: Student

## 2021-12-08 NOTE — Telephone Encounter (Signed)
Need appt with me before next refill

## 2022-01-01 ENCOUNTER — Other Ambulatory Visit: Payer: Self-pay | Admitting: Student

## 2022-01-01 DIAGNOSIS — I10 Essential (primary) hypertension: Secondary | ICD-10-CM

## 2022-01-25 ENCOUNTER — Other Ambulatory Visit: Payer: Self-pay | Admitting: Student

## 2022-01-25 DIAGNOSIS — I10 Essential (primary) hypertension: Secondary | ICD-10-CM

## 2022-02-11 ENCOUNTER — Other Ambulatory Visit: Payer: Self-pay | Admitting: Student

## 2022-02-18 ENCOUNTER — Other Ambulatory Visit: Payer: Self-pay | Admitting: Student

## 2022-02-18 DIAGNOSIS — I10 Essential (primary) hypertension: Secondary | ICD-10-CM

## 2022-04-12 ENCOUNTER — Other Ambulatory Visit: Payer: Self-pay | Admitting: Student

## 2022-04-13 ENCOUNTER — Other Ambulatory Visit: Payer: Self-pay | Admitting: Student

## 2022-04-13 DIAGNOSIS — N522 Drug-induced erectile dysfunction: Secondary | ICD-10-CM

## 2022-05-08 ENCOUNTER — Other Ambulatory Visit: Payer: Self-pay | Admitting: Student

## 2022-05-16 ENCOUNTER — Other Ambulatory Visit: Payer: Self-pay | Admitting: Student

## 2022-05-16 DIAGNOSIS — I10 Essential (primary) hypertension: Secondary | ICD-10-CM

## 2022-05-28 ENCOUNTER — Encounter: Payer: Self-pay | Admitting: Student

## 2022-05-28 ENCOUNTER — Ambulatory Visit (INDEPENDENT_AMBULATORY_CARE_PROVIDER_SITE_OTHER): Payer: Self-pay | Admitting: Student

## 2022-05-28 VITALS — BP 128/84 | HR 86 | Ht 74.0 in | Wt 282.0 lb

## 2022-05-28 DIAGNOSIS — I10 Essential (primary) hypertension: Secondary | ICD-10-CM

## 2022-05-28 DIAGNOSIS — Z23 Encounter for immunization: Secondary | ICD-10-CM

## 2022-05-28 DIAGNOSIS — Z0000 Encounter for general adult medical examination without abnormal findings: Secondary | ICD-10-CM | POA: Diagnosis not present

## 2022-05-28 NOTE — Assessment & Plan Note (Addendum)
BP: 128/84 today. Well controlled. Goal of <130/80. Continue to work on healthy dietary habits and exercise. Follow up in 6 months.   Medication regimen: Amlodipine 10 mg and Hyzaar 50-12.5 UTD BMP ordered  Consider PSA, lipid panel on next check

## 2022-05-28 NOTE — Patient Instructions (Addendum)
It was great to see you today! Thank you for choosing [facility] for your primary care. Aaron Bond was seen for follow up.  Today we addressed: -Continue with current medications  -I will let you know about your labs -Give me a call if you want to discuss nutrition   If you haven't already, sign up for My Chart to have easy access to your labs results, and communication with your primary care physician.  We are checking some labs today. If they are abnormal, I will call you. If they are normal, I will send you a MyChart message (if it is active) or a letter in the mail. If you do not hear about your labs in the next 2 weeks, please call the office. I recommend that you always bring your medications to each appointment as this makes it easy to ensure you are on the correct medications and helps [place] not miss refills when you need them. Call the clinic at [phone] if your symptoms worsen or you have any concerns.  You should return to our clinic Return in about 6 months (around 11/26/2022) for 6 month-1 year. Please arrive 15 minutes before your appointment to ensure smooth check in process.  We appreciate your efforts in making this happen.  Thank you for allowing me to participate in your care, Erskine Emery, MD 05/28/2022, 10:17 AM PGY-2, [place]

## 2022-05-28 NOTE — Progress Notes (Signed)
  SUBJECTIVE:   CHIEF COMPLAINT / HPI:   Hypertension: BP: 128/84 today. Home medications include: Amlodipine 10 mg and Hyzaar 50-12.5. He endorses taking these medications as prescribed. Does not check blood pressure at home.  Most recent creatinine trend:  Lab Results  Component Value Date   CREATININE 1.05 11/17/2020   CREATININE 1.07 02/02/2019   CREATININE 0.97 01/24/2018   Patient has not had a BMP in the past 1 year.  Charcot-Marie-Tooth: Wears orthotics and is tolerating them well.   Takes Trazodone for insomnia.   PERTINENT  PMH / PSH:   Past Medical History:  Diagnosis Date   Charcot-Marie-Tooth disease    Charcot-Marie-Tooth disease    CMT (Charcot-Marie-Tooth disease)    ED (erectile dysfunction)    Hypertension    Insomnia    Seasonal allergies     OBJECTIVE:  BP 128/84   Pulse 86   Ht '6\' 2"'$  (1.88 m)   Wt 282 lb (127.9 kg)   SpO2 98%   BMI 36.21 kg/m  Physical Exam  General: Alert and oriented in no apparent distress Heart: Regular rate and rhythm with no murmurs appreciated Lungs: CTA bilaterally, no wheezing Abdomen: no abdominal pain Skin: Warm and dry Extremities: No lower extremity edema  ASSESSMENT/PLAN:  Healthcare maintenance -     Basic metabolic panel  Need for immunization against influenza -     Flu Vaccine QUAD 51moIM (Fluarix, Fluzone & Alfiuria Quad PF)  Encounter for immunization -     PFIZER Comirnaty(GRAY TOP)COVID-19 Vaccine  Essential hypertension, benign Assessment & Plan: BP: 128/84 today. Well controlled. Goal of <130/80. Continue to work on healthy dietary habits and exercise. Follow up in 6 months.   Medication regimen: Amlodipine 10 mg and Hyzaar 50-12.5 UTD BMP ordered  Consider PSA, lipid panel on next check     Return in about 6 months (around 11/26/2022) for 6 month-1 year. AErskine Emery MD 05/28/2022, 12:30 PM PGY-2, [place]

## 2022-05-29 LAB — BASIC METABOLIC PANEL
BUN/Creatinine Ratio: 17 (ref 9–20)
BUN: 18 mg/dL (ref 6–24)
CO2: 22 mmol/L (ref 20–29)
Calcium: 9.6 mg/dL (ref 8.7–10.2)
Chloride: 104 mmol/L (ref 96–106)
Creatinine, Ser: 1.03 mg/dL (ref 0.76–1.27)
Glucose: 104 mg/dL — ABNORMAL HIGH (ref 70–99)
Potassium: 3.4 mmol/L — ABNORMAL LOW (ref 3.5–5.2)
Sodium: 142 mmol/L (ref 134–144)
eGFR: 85 mL/min/{1.73_m2} (ref >59)

## 2022-05-31 ENCOUNTER — Other Ambulatory Visit: Payer: Self-pay | Admitting: Student

## 2022-05-31 DIAGNOSIS — E876 Hypokalemia: Secondary | ICD-10-CM

## 2022-05-31 MED ORDER — POTASSIUM CHLORIDE CRYS ER 20 MEQ PO TBCR: 20.0000 meq | EXTENDED_RELEASE_TABLET | Freq: Two times a day (BID) | ORAL | 0 refills | Status: DC

## 2022-06-07 ENCOUNTER — Other Ambulatory Visit: Payer: Self-pay | Admitting: Student

## 2022-06-07 DIAGNOSIS — E876 Hypokalemia: Secondary | ICD-10-CM

## 2022-07-06 ENCOUNTER — Other Ambulatory Visit: Payer: Self-pay | Admitting: Student

## 2022-07-06 DIAGNOSIS — I10 Essential (primary) hypertension: Secondary | ICD-10-CM

## 2022-08-19 ENCOUNTER — Other Ambulatory Visit: Payer: Self-pay | Admitting: Student

## 2022-08-19 DIAGNOSIS — I10 Essential (primary) hypertension: Secondary | ICD-10-CM

## 2022-09-20 ENCOUNTER — Encounter: Payer: Self-pay | Admitting: Orthopedic Surgery

## 2022-09-20 ENCOUNTER — Ambulatory Visit (INDEPENDENT_AMBULATORY_CARE_PROVIDER_SITE_OTHER): Payer: Managed Care, Other (non HMO) | Admitting: Orthopedic Surgery

## 2022-09-20 DIAGNOSIS — G8929 Other chronic pain: Secondary | ICD-10-CM

## 2022-09-20 DIAGNOSIS — M25561 Pain in right knee: Secondary | ICD-10-CM

## 2022-09-20 MED ORDER — LIDOCAINE HCL (PF) 1 % IJ SOLN
5.0000 mL | INTRAMUSCULAR | Status: AC | PRN
Start: 2022-09-20 — End: 2022-09-20
  Administered 2022-09-20: 5 mL

## 2022-09-20 MED ORDER — METHYLPREDNISOLONE ACETATE 40 MG/ML IJ SUSP
40.0000 mg | INTRAMUSCULAR | Status: AC | PRN
Start: 2022-09-20 — End: 2022-09-20
  Administered 2022-09-20: 40 mg via INTRA_ARTICULAR

## 2022-09-20 NOTE — Progress Notes (Signed)
Office Visit Note   Patient: Aaron Bond           Date of Birth: 1966/04/11           MRN: [id number] Visit Date: 09/20/2022              Requested by: Alfredo Martinez, MD [address] [place],  [place] [address] PCP: Alfredo Martinez, MD  Chief Complaint  Patient presents with   Right Knee - Pain      HPI: Patient is a 57 year old gentleman with chronic right knee pain.  Patient has had a previous steroid injection 3 years ago with good relief.  Patient states that 2 weeks ago he could not put weight on his knee.  Assessment & Plan: Visit Diagnoses:  1. Chronic pain of right knee     Plan: Right knee was injected he tolerated this well we could proceed with further injections as needed.  Also discussed postoperative care for total knee replacement.  Recommended sole orthotics with a met pad.  Follow-Up Instructions: Return if symptoms worsen or fail to improve.   Ortho Exam  Patient is alert, oriented, no adenopathy, well-dressed, normal affect, normal respiratory effort. Examination patient has crepitation with range of motion of the right knee there is mild effusion collaterals and cruciates are stable there is tenderness to palpation the patellofemoral joint as well as medial lateral joint line.  Patient also has high arch bilaterally.  Imaging: No results found. No images are attached to the encounter.  Labs: No results found for: "HGBA1C", "ESRSEDRATE", "CRP", "LABURIC", "REPTSTATUS", "GRAMSTAIN", "CULT", "LABORGA"   Lab Results  Component Value Date   ALBUMIN 4.0 05/13/2015   ALBUMIN 4.2 04/19/2011    No results found for: "MG" No results found for: "VD25OH"  No results found for: "PREALBUMIN"    Latest Ref Rng & Units 01/09/2018    6:39 PM  CBC EXTENDED  WBC 3.8 - 10.6 K/uL 4.9   RBC 4.40 - 5.90 MIL/uL 4.83   Hemoglobin 13.0 - 18.0 g/dL 19.1   HCT 47.8 - 29.5 % 43.2   Platelets 150 - 440 K/uL 233   NEUT# 1.4 - 6.5 K/uL 1.6   Lymph# 1.0 - 3.6 K/uL  2.1      There is no height or weight on file to calculate BMI.  Orders:  No orders of the defined types were placed in this encounter.  No orders of the defined types were placed in this encounter.    Procedures: Large Joint Inj: R knee on 09/20/2022 3:18 PM Indications: pain and diagnostic evaluation Details: 22 G 1.5 in needle, anteromedial approach  Arthrogram: No  Medications: 5 mL lidocaine (PF) 1 %; 40 mg methylPREDNISolone acetate 40 MG/ML Outcome: tolerated well, no immediate complications Procedure, treatment alternatives, risks and benefits explained, specific risks discussed. Consent was given by the patient. Immediately prior to procedure a time out was called to verify the correct patient, procedure, equipment, support staff and site/side marked as required. Patient was prepped and draped in the usual sterile fashion.      Clinical Data: No additional findings.  ROS:  All other systems negative, except as noted in the HPI. Review of Systems  Objective: Vital Signs: There were no vitals taken for this visit.  Specialty Comments:  No specialty comments available.  PMFS History: Patient Active Problem List   Diagnosis Date Noted   Healthcare maintenance 03/30/2021   Charcot-Marie-Tooth disease 11/18/2020   Closed nondisplaced fracture of proximal phalanx  of lesser toe of left foot 05/22/2019   Insomnia 02/02/2019   Essential hypertension, benign 05/15/2015   Low back pain 12/23/2011   Tobacco abuse 09/26/2006   OBESITY, NOS 07/07/2006   NEUROPATHY, PERIPHERAL 07/07/2006   RHINITIS, ALLERGIC 07/07/2006   Erectile dysfunction 07/07/2006   INSOMNIA NOS 07/07/2006   Past Medical History:  Diagnosis Date   Charcot-Marie-Tooth disease    Charcot-Marie-Tooth disease    CMT (Charcot-Marie-Tooth disease)    ED (erectile dysfunction)    Hypertension    Insomnia    Seasonal allergies     Family History  Problem Relation Age of Onset   Colon cancer  Neg Hx    Colon polyps Neg Hx    Esophageal cancer Neg Hx    Rectal cancer Neg Hx    Stomach cancer Neg Hx     Past Surgical History:  Procedure Laterality Date   NO PAST SURGERIES     Social History   Occupational History   Not on file  Tobacco Use   Smoking status: Every Day    Packs/day: .1    Types: Cigarettes   Smokeless tobacco: Never   Tobacco comments:    2-3 cigarettes daily  Vaping Use   Vaping Use: Never used  Substance and Sexual Activity   Alcohol use: Not on file    Comment: occ   Drug use: Yes    Types: Marijuana    Comment: in 20"s   Sexual activity: Not on file

## 2022-11-10 ENCOUNTER — Other Ambulatory Visit: Payer: Self-pay | Admitting: Student

## 2022-11-11 ENCOUNTER — Other Ambulatory Visit: Payer: Self-pay | Admitting: Student

## 2022-12-30 ENCOUNTER — Other Ambulatory Visit: Payer: Self-pay | Admitting: Student

## 2022-12-30 DIAGNOSIS — I10 Essential (primary) hypertension: Secondary | ICD-10-CM

## 2023-02-10 ENCOUNTER — Other Ambulatory Visit: Payer: Self-pay | Admitting: Student

## 2023-02-10 DIAGNOSIS — N522 Drug-induced erectile dysfunction: Secondary | ICD-10-CM

## 2023-02-13 NOTE — Telephone Encounter (Signed)
Needs appt before next refill 

## 2023-02-15 NOTE — Telephone Encounter (Signed)
Called and informed patient about medication. Also scheduled appointment.   Thank you Aaron Bond

## 2023-03-14 ENCOUNTER — Ambulatory Visit (INDEPENDENT_AMBULATORY_CARE_PROVIDER_SITE_OTHER): Payer: Managed Care, Other (non HMO) | Admitting: Student

## 2023-03-14 VITALS — BP 154/97 | HR 90 | Temp 98.2°F | Ht 74.0 in | Wt 285.6 lb

## 2023-03-14 DIAGNOSIS — Z6836 Body mass index (BMI) 36.0-36.9, adult: Secondary | ICD-10-CM | POA: Diagnosis not present

## 2023-03-14 DIAGNOSIS — R051 Acute cough: Secondary | ICD-10-CM | POA: Diagnosis not present

## 2023-03-14 DIAGNOSIS — G4700 Insomnia, unspecified: Secondary | ICD-10-CM

## 2023-03-14 DIAGNOSIS — E66812 Obesity, class 2: Secondary | ICD-10-CM

## 2023-03-14 DIAGNOSIS — I10 Essential (primary) hypertension: Secondary | ICD-10-CM

## 2023-03-14 DIAGNOSIS — M545 Low back pain, unspecified: Secondary | ICD-10-CM

## 2023-03-14 DIAGNOSIS — E876 Hypokalemia: Secondary | ICD-10-CM

## 2023-03-14 DIAGNOSIS — G8929 Other chronic pain: Secondary | ICD-10-CM

## 2023-03-14 LAB — POCT GLYCOSYLATED HEMOGLOBIN (HGB A1C): Hemoglobin A1C: 5.9 % — AB (ref 4.0–5.6)

## 2023-03-14 LAB — POC SOFIA 2 FLU + SARS ANTIGEN FIA
Influenza A, POC: NEGATIVE
Influenza B, POC: NEGATIVE
SARS Coronavirus 2 Ag: POSITIVE — AB

## 2023-03-14 MED ORDER — NAPROXEN 500 MG PO TABS: 500.0000 mg | ORAL_TABLET | Freq: Two times a day (BID) | ORAL | 0 refills | Status: AC

## 2023-03-14 MED ORDER — GUAIFENESIN 100 MG/5ML PO LIQD: 5.0000 mL | ORAL | 0 refills | Status: DC | PRN

## 2023-03-14 NOTE — Assessment & Plan Note (Signed)
Seems to have left-sided paraspinal lumbar pain as well as left hip pain, unsure if related.  Both may be related to change in gait from Charcot-Marie-Tooth.  Discussed physical therapy as well as naproxen 500 mg twice daily with food for a total of 5 days before continuing with ibuprofen and Tylenol as needed.  Also placed a referral to sports medicine for assistance with orthotics if they need reassessment.  Referral to physical therapy placed.  Return in 4 weeks for further assessment, may need imaging at that time.

## 2023-03-14 NOTE — Progress Notes (Signed)
SUBJECTIVE:   CHIEF COMPLAINT / HPI:   Hypertension: BP: (!) 154/97 today. Home medications include: Amlodipine 10 mg and Hyzaar 50-12.5. He endorses taking these medications as prescribed. Does check blood pressure at home. Most recent creatinine trend:  Lab Results  Component Value Date   CREATININE 1.03 05/28/2022   CREATININE 1.05 11/17/2020   CREATININE 1.07 02/02/2019   Patient has had a BMP in the past 1 year.  Cough:  Patient complains of myalgias, nasal congestion, and cough .   Symptoms began 1 day ago.   Wife has COVID  Patient does not have a history of asthma.  Patient does not have a history of environmental allergens. Patient does not have recent travel.  Patient  does not have previous Chest X-ray.  No concerns with PO intake   Back and Hip Pain:  - Ongoing a couple of months on left, left lower back and hip, no groin pain  - Worsening over past few weeks  - Taking Ibuprofen and Tylenol routinely, helps the pain to some extent. 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  - Reports intermittent left-sided radicular pain over the anterior aspect of the side - No change in sensation of the lower extremity, no bowel or bladder incontinence   PERTINENT  PMH / PSH:  Charcot Marie Tooth-Has orthotics  Insomnia  Difficulty sleeping: Trazodone  Erectile dysfunction   OBJECTIVE:   BP (!) 154/97   Pulse 90   Temp 98.2 F (36.8 C)   Ht 6\' 2"  (1.88 m)   Wt 285 lb 9.6 oz (129.5 kg)   SpO2 99%   BMI 36.67 kg/m   General: Alert and oriented in no apparent distress Heart: Regular rate and rhythm with no murmurs appreciated Lungs: CTA bilaterally, no wheezing Abdomen: no abdominal pain Skin: Warm and dry Extremities: Lower extremity strength 5 out of 5, tenderness to palpation of the lumbar  paraspinal muscles but no bony spinous process tenderness.  Pain with internal rotation in lateral aspect of the hip.  Tenderness to palpation over the lateral aspect of the hip.  SLR negative on left    ASSESSMENT/PLAN:   Assessment & Plan Acute cough COVID-positive, provided work note.  No shortness of breath or chest pain.  Discussed conservative treatment measures in addition to Robitussin, ordered to pharmacy. Chronic left-sided low back pain without sciatica Seems to have left-sided paraspinal lumbar pain as well as left hip pain, unsure if related.  Both may be related to change in gait from Charcot-Marie-Tooth.  Discussed physical therapy as well as naproxen 500 mg twice daily with food for a total of 5 days before continuing with ibuprofen and Tylenol as needed.  Also placed a referral to sports medicine for assistance with orthotics if they need reassessment.  Referral to physical therapy placed.  Return in 4 weeks for further assessment, may need imaging at that time.  Class 2 obesity without serious comorbidity with body mass index (BMI) of 36.0 to 36.9 in adult, unspecified obesity type At the end of the visit, mentions that he would like to discuss weight loss medications.  Will obtain A1c to assess coverage of GLP-1.  If he does not meet criteria for this, consider sending to healthy weight and wellness Essential hypertension, benign Elevated BP x 2 checks,  has not taken medications today.  Reports systolics of 120s at home.  Will keep normal medication regimen and recheck blood pressure at next visit.  Recheck BMP today.     Alfredo Martinez, MD [facility]

## 2023-03-14 NOTE — Patient Instructions (Addendum)
It was great to see you today! Thank you for choosing [facility] for your primary care.  Today we addressed: For covid, take Robitussin as instructed   For hip and back pain Take Naproxyn 500 mg twice a day with food for 5 days and sent a referral to physical therapy, they will call you  Also sent a referral to the sports medicine docs down the street, they fit for orthotics and can help with muscular pain. They will call to schedule appt   If you haven't already, sign up for My Chart to have easy access to your labs results, and communication with your primary care physician. I recommend that you always bring your medications to each appointment as this makes it easy to ensure you are on the correct medications and helps [place] not miss refills when you need them. Call the clinic at [phone] if your symptoms worsen or you have any concerns. Follow up in 1 month  Please arrive 15 minutes before your appointment to ensure smooth check in process.  We appreciate your efforts in making this happen.  Thank you for allowing me to participate in your care, Aaron Martinez, MD 03/14/2023, 12:03 PM PGY-3, [facility]

## 2023-03-14 NOTE — Assessment & Plan Note (Signed)
Elevated BP x 2 checks, has not taken medications today.  Reports systolics of 120s at home.  Will keep normal medication regimen and recheck blood pressure at next visit.  Recheck BMP today.

## 2023-03-14 NOTE — Assessment & Plan Note (Signed)
At the end of the visit, mentions that he would like to discuss weight loss medications.  Will obtain A1c to assess coverage of GLP-1.  If he does not meet criteria for this, consider sending to healthy weight and wellness

## 2023-03-15 LAB — BASIC METABOLIC PANEL
BUN/Creatinine Ratio: 13 (ref 9–20)
BUN: 12 mg/dL (ref 6–24)
CO2: 24 mmol/L (ref 20–29)
Calcium: 9.4 mg/dL (ref 8.7–10.2)
Chloride: 105 mmol/L (ref 96–106)
Creatinine, Ser: 0.95 mg/dL (ref 0.76–1.27)
Glucose: 96 mg/dL (ref 70–99)
Potassium: 3.9 mmol/L (ref 3.5–5.2)
Sodium: 144 mmol/L (ref 134–144)
eGFR: 93 mL/min/{1.73_m2} (ref >59)

## 2023-03-18 ENCOUNTER — Ambulatory Visit (INDEPENDENT_AMBULATORY_CARE_PROVIDER_SITE_OTHER): Payer: Managed Care, Other (non HMO) | Admitting: Family Medicine

## 2023-03-18 VITALS — BP 135/86 | Ht 74.0 in | Wt 285.0 lb

## 2023-03-18 DIAGNOSIS — M25552 Pain in left hip: Secondary | ICD-10-CM

## 2023-03-18 DIAGNOSIS — R269 Unspecified abnormalities of gait and mobility: Secondary | ICD-10-CM | POA: Diagnosis not present

## 2023-03-18 MED ORDER — METHYLPREDNISOLONE ACETATE 40 MG/ML IJ SUSP
40.0000 mg | Freq: Once | INTRAMUSCULAR | Status: AC
  Administered 2023-03-18: 40 mg via INTRA_ARTICULAR

## 2023-03-18 NOTE — Patient Instructions (Signed)
Most of your current pain is coming from a condition called greater trochanteric pain syndrome (also known as trochanteric bursitis)  We did a cortisone injection of this today as described below.  I would like for you to keep your appointment with physical therapy as that is the mainstay of stretching and strengthening the muscles of the lateral hip to prevent this issue from happening again.  Let's follow-up in 4-6 weeks to see how you are doing.  Today you received an injection with a corticosteroid (aka: cortisone injection). This injection is usually done in response to pain and inflammation. There is some "numbing medicine" (Lidocaine) in the shot, so the injected area may be numb and feel really good for the next couple of hours. The numbing medicine usually wears off in 2-3 hours, and then your pain level may be back to where it was before the injection until the cortisone starts working.    The actually benefit from the steroid injection is usually noticed within 3-5 days, but may take up to 14 days. You may actually experience a small (as in 10%) INCREASE in pain in the first 24 hours---that is common.  Things to watch out for that you should contact us or a health care provider urgently would include: 1. Unusual (as in more than 10%) increase in pain 2. New fever > 101.5 3. New swelling or redness of the injected area. 4. Streaking of red lines around the area injected.  Do not hesitate to call or reach out with any questions or concerns.

## 2023-03-19 NOTE — Progress Notes (Signed)
[facility]: Attending Note: I have examined the patient, reviewed the chart, discussed the assessment and plan with the Sports Medicine Fellow. I agree with assessment and treatment plan as detailed in the Fellow's note. Supervised key portions of visit, procedure.

## 2023-03-19 NOTE — Progress Notes (Signed)
PCP: Alfredo Martinez, MD  SUBJECTIVE:   HPI:  Patient is a 57 y.o. male here with chief complaint of left hip and low back pain. He states this has been ongoing for roughly 3 months, though over the past 3 weeks has been functionally impairing him both at home and work. He was recently seen by his PCP for this and referred here. 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 It worsens with prolonged sitting and standing. Improves some with NSAIDs, but getting short-lived benefit. Locates pain primarily to the lateral left hip with some radiation down the lateral hip to about mid thigh. No perceived weakness and no issues with incontinence.  ROS:     See HPI  PERTINENT  PMH / PSH FH / / SH:  Past Medical, Surgical, Social, and Family History Reviewed & Updated in the EMR.  Pertinent findings include:  Charcot-Marie-Tooth Disease Pre-diabetes, reviewed most recent A1c of 5.9 prior to injection  Past Surgical History:  Procedure Laterality Date   NO PAST SURGERIES     No Known Allergies  OBJECTIVE:  BP 135/86   Ht 6\' 2"  (1.88 m)   Wt 285 lb (129.3 kg)   BMI 36.59 kg/m   PHYSICAL EXAM:  GEN: Alert and Oriented, NAD, comfortable in exam room RESP: Unlabored respirations, symmetric chest rise PSY: normal mood, congruent affect   Lumbar/Left Hip MSK EXAM: No deformity, swelling, bruising, atrophy. ROM: full hip flexion and IR, though reduced ER due to pain.  Strength: flexion 5/5 (no groin pain, + lateral hip pain), abduction 4/5 (+ lateral hip pain) TTP over left greater troch, proximal IT band, distal glute med tendons. No SI joint or lumbar midline/paraspinal TTP. No anterior hip, AIIS, ASIS TTP. Neurovascularly intact distally. Negative logroll Negative faber, fadir, and piriformis stretches. Though FABER and FADIR do elicit lateral hip pain.  Gait  analysis: significant trendelenburg gait on the left.   Assessment & Plan Greater trochanteric pain syndrome of left lower extremity This is an acute on chronic issue for him. His risk factors include his Charcot-Marie-Tooth Disease, obesity, and hip abductor weakness. Given his functional limitations and failure to respond to conservative measures we discussed trial of cortisone injection which he was amenable to, performed as below. Placed a new PT referral to focus both on his lateral hip as well as gait training for his trendelenburg gait. Recommended continuation of as needed NSAIDs, can incorporate tylenol and heating pad therapy as needed. Follow-up in 6-8 weeks. Abnormality of gait Trendelenburg gait and known Charcot-Marie-Tooth disease. He was provided orthotics back in 2022 through our office. In gait analysis today I think he would benefit better from rehabilitation than modification of orthotics, though if he isn't making great progress recommend re-evaluation of his orthotics.  PROCEDURE: Left Greater Trochanteric Bursa Injection After informed written consent, patient placed in right lateral decubitus position. Appropriate time out was taken. Area prepped and draped in usual sterile fashion. Ethyl chloride was used for local anesthesia. A 22 gauge 3.5 inch needle was used. 1 cc of methylprednisolone 40 mg/ml plus 4 cc of 1% lidocaine without epinephrine was injected into the left greater trochanteric hip bursa using a perpendicular approach.  Patient tolerated procedure well without immediate complications. The patient was counseled as to the expected post-injection course, including the possibility of worsening of pain with steroid flare. Instructed as to  concerning symptoms and advised to contact the office if these should arise.   Glean Salen, MD PGY-4, Sports Medicine Fellow [facility]

## 2023-03-24 ENCOUNTER — Ambulatory Visit: Payer: Managed Care, Other (non HMO) | Admitting: Family Medicine

## 2023-03-24 VITALS — BP 125/85 | Ht 74.0 in | Wt 285.0 lb

## 2023-03-24 DIAGNOSIS — R269 Unspecified abnormalities of gait and mobility: Secondary | ICD-10-CM | POA: Diagnosis not present

## 2023-03-24 DIAGNOSIS — B070 Plantar wart: Secondary | ICD-10-CM

## 2023-03-24 DIAGNOSIS — M25552 Pain in left hip: Secondary | ICD-10-CM | POA: Diagnosis not present

## 2023-03-24 DIAGNOSIS — G600 Hereditary motor and sensory neuropathy: Secondary | ICD-10-CM

## 2023-03-24 DIAGNOSIS — M2021 Hallux rigidus, right foot: Secondary | ICD-10-CM | POA: Diagnosis not present

## 2023-03-24 MED ORDER — MELOXICAM 15 MG PO TABS: 15.0000 mg | ORAL_TABLET | Freq: Every day | ORAL | 0 refills | Status: DC

## 2023-03-24 NOTE — Assessment & Plan Note (Addendum)
Chronic issue for him. He was provided orthotics back in 2022 through our office. He may eventually need updated orthotics at follow-up. - Continue current OTC orthotics and tx for plantar wart above. - Will reassess at f/u's for need of updating orthotics with 1st ray post or dancer pad. May need some lateral heel wedging as well on the right.

## 2023-03-24 NOTE — Patient Instructions (Signed)
You are still dealing with trochanteric bursitis of the left hip. This is probably driven by your gait changes, which is related to both your CMT as well as the plantar wart under your great toe. Please call you primary care doctor to have the wart frozen off. I recommend 2 weeks of Meloxicam and waiting another week to give the injection you received last week a full 14 days to know if it was effective or not.  Start the stretches for your left hip then follow the guidance of physical therapy once you start.  We can see you back in early January to check in on how you are doing with therapy and possibly consider a new pair of orthotics

## 2023-03-24 NOTE — Progress Notes (Signed)
PCP: Alfredo Martinez, MD  SUBJECTIVE:   HPI:  Patient is a 57 y.o. male here with continued left lateral hip pain. I saw him last week for the same issue and did a cortisone injection into the left trochanteric bursa.  He states that he only had short-lived benefit from the injection maybe for an hour to though has been continually sore since then.  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  Again denies any radiation of pain down the back of the lower extremity though does have some tightness in his upper lateral thigh as well.  He has tried Aleve with mild improvement.  He thinks that his gait abnormalities related to his right foot pain are driving the posterior lateral left hip pain.  ROS:     See HPI  PERTINENT  PMH / PSH FH / / SH:  Past Medical, Surgical, Social, and Family History Reviewed & Updated in the EMR.  Pertinent findings include:  Charcot-Marie-Tooth Disease Pre-diabetes, reviewed most recent A1c of 5.9 prior to injection  Past Surgical History:  Procedure Laterality Date   NO PAST SURGERIES     No Known Allergies  OBJECTIVE:  BP (!) 142/82   Ht 6\' 2"  (1.88 m)   Wt 285 lb (129.3 kg)   BMI 36.59 kg/m   PHYSICAL EXAM:  GEN: Alert and Oriented, NAD, comfortable in exam room RESP: Unlabored respirations, symmetric chest rise PSY: normal mood, congruent affect   Lumbar/Left Hip MSK EXAM:  No deformity, swelling, bruising, atrophy. ROM: full hip flexion and IR, though reduced ER due to pain.  Strength: flexion 5/5 (no groin pain, + lateral hip pain), abduction 4/5 (+ lateral hip pain) TTP over left greater troch, proximal IT band, distal glute med tendons. No SI joint or lumbar midline/paraspinal TTP. No anterior hip, AIIS, ASIS TTP. Neurovascularly intact distally. Negative logroll Negative faber, fadir, and piriformis stretches. Though FABER > FADIR do  elicit lateral hip pain.   Foot Exam: Cavovarus foot bilaterally with some hammering of toes 2 through 5.  He has a bandage over a callus on the lateral aspect of his left fifth MTP joint.  He also has a donut pad over a rough, thickened circular lesion on the plantar aspect of the right first MTP joint that is TTP. Hallux valgus deformity of bilateral MTPs ROM: stiff great toe with limitation in toe extension Gait analysis: significant trendelenburg gait on the left.  Assessment & Plan Greater trochanteric pain syndrome of left lower extremity Persistent pain of the left lateral hip c/w GTPS secondary to his gait abnormality/Charcot Marie Tooth Dz, obesity, and hip abductor weakness. - I suspect he may still benefit from the cortisone injection he received 6 days ago, encouraged waiting full 14 days before we know whether or not this will be beneficial or not. - Provided glute stretches to work on while awaiting getting in with PT, currently scheduled for 04/05/23 - 2 weeks of Mobic 15mg  every day, Rx provided - F/u in 6-8 weeks after PT or sooner if issues worsen Abnormality of gait Trendelenburg gait and known Charcot-Marie-Tooth disease.  - Working on hip abductors as above - May need updated orthotics in the future. Plantar wart of right foot Located on plantar surface of right 1st MTP joint. Think this is the main driver of his foot pain, though some contribution  from his Hallux Rigidus.  - Recommended PCP follow-up for series of cryotherapy +/- paring - Continue donut pad for now, may benefit from 1st ray post and new orthotics at f/u Hallux rigidus of right foot See plantar wart discussion above. Charcot-Marie-Tooth disease Chronic issue for him. He was provided orthotics back in 2022 through our office. He may eventually need updated orthotics at follow-up. - Continue current OTC orthotics and tx for plantar wart above. - Will reassess at f/u's for need of updating orthotics with  1st ray post or dancer pad. May need some lateral heel wedging as well on the right. Patient's questions were answered and they are in agreement with this plan.   Glean Salen, MD PGY-4, Sports Medicine Fellow [facility]  Addendum:  Patient seen and examined in the office with fellow.   History, exam, plan of care were precepted with me.  Agree with findings as documented in fellow note  above.  Darene Lamer, DO, CAQSM

## 2023-03-25 ENCOUNTER — Encounter: Payer: Self-pay | Admitting: Family Medicine

## 2023-04-04 NOTE — Therapy (Unsigned)
OUTPATIENT PHYSICAL THERAPY LOWER EXTREMITY EVALUATION   Patient Name: Aaron Bond MRN: [id number] DOB:[birth year], 57 y.o., male Today's Date: 04/05/2023  END OF SESSION:  PT End of Session - 04/05/23 1443     Visit Number 1    Number of Visits 7    Authorization Type [employer]    PT Start Time 1400    PT Stop Time 1445    PT Time Calculation (min) 45 min    Activity Tolerance Patient tolerated treatment well    Behavior During Therapy WFL for tasks assessed/performed             Past Medical History:  Diagnosis Date   Charcot-Marie-Tooth disease    Charcot-Marie-Tooth disease    CMT (Charcot-Marie-Tooth disease)    ED (erectile dysfunction)    Hypertension    Insomnia    Seasonal allergies    Past Surgical History:  Procedure Laterality Date   NO PAST SURGERIES     Patient Active Problem List   Diagnosis Date Noted   Healthcare maintenance 03/30/2021   Charcot-Marie-Tooth disease 11/18/2020   Essential hypertension, benign 05/15/2015   Low back pain 12/23/2011   Tobacco abuse 09/26/2006   OBESITY, NOS 07/07/2006   NEUROPATHY, PERIPHERAL 07/07/2006   RHINITIS, ALLERGIC 07/07/2006   Erectile dysfunction 07/07/2006   Insomnia 07/07/2006    PCP: Alfredo Martinez, MD   REFERRING PROVIDER: Nestor Ramp, MD  REFERRING DIAG: [phone] (ICD-10-CM) - Greater trochanteric pain syndrome of left lower extremity  THERAPY DIAG:  Trochanteric bursitis of left hip  Muscle weakness (generalized)  Other abnormalities of gait and mobility  Rationale for Evaluation and Treatment: Rehabilitation  ONSET DATE: 1 month lately but chronic in the past  SUBJECTIVE:   SUBJECTIVE STATEMENT: Describes L hip pain ongoing by one month.  Has had recent trochanteric bursa injected which improved symptoms.  Has had similar issues in the past in same hip.  PMHx of CMT  PERTINENT HISTORY: Patient is a 57 y.o. male here with chief complaint of left hip and low back pain. He states  this has been ongoing for roughly 3 months, though over the past 3 weeks has been functionally impairing him both at home and work. He was recently seen by his PCP for this and referred here. 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 It worsens with prolonged sitting and standing. Improves some with NSAIDs, but getting short-lived benefit. Locates pain primarily to the lateral left hip with some radiation down the lateral hip to about mid thigh. No perceived weakness and no issues with incontinence. PAIN:  Are you having pain? Yes: NPRS scale: 6-7/10 Pain location: L hip Pain description: ache Aggravating factors: certain movements Relieving factors: injection, position changes   PRECAUTIONS: Fall  RED FLAGS: None   WEIGHT BEARING RESTRICTIONS: No  FALLS:  Has patient fallen in last 6 months? No  OCCUPATION: Clinical biochemist  PLOF: Independent  PATIENT GOALS: To manage my hip pain  NEXT MD VISIT: TBD  OBJECTIVE:  Note: Objective measures were completed at Evaluation unless otherwise noted.  DIAGNOSTIC FINDINGS: none  PATIENT SURVEYS:  FOTO 54(66 predicted)   MUSCLE LENGTH: Hamstrings: Right 70 deg; Left 70 deg Thomas test: positive L  POSTURE: No Significant postural limitations  PALPATION: Mild TTP to L greater trochanter and piriformis   LOWER EXTREMITY ROM:  Active ROM Right eval Left eval  Hip flexion  90d  Hip extension  0d  Hip abduction    Hip adduction    Hip internal rotation    Hip external rotation    Knee flexion    Knee extension    Ankle dorsiflexion    Ankle plantarflexion    Ankle inversion    Ankle eversion     (Blank rows = not tested)  LOWER EXTREMITY MMT:  MMT Right eval Left eval  Hip flexion  4-  Hip extension  4-  Hip abduction  4-  Hip adduction    Hip internal rotation    Hip external rotation     Knee flexion    Knee extension    Ankle dorsiflexion    Ankle plantarflexion    Ankle inversion    Ankle eversion     (Blank rows = not tested)  LOWER EXTREMITY SPECIAL TESTS:  Hip special tests: Luisa Hart (FABER) test: positive , Anterior hip impingement test: negative, and no ITB tightness identified  FUNCTIONAL TESTS:  30 seconds chair stand test 4 reps  GAIT: Distance walked: 47ft x2 Assistive device utilized: None Level of assistance: Complete Independence Comments: foot slap B due to CMT   TODAY'S TREATMENT:                                                                                                                              DATE: 04/05/23    PATIENT EDUCATION:  Education details: Discussed eval findings, rehab rationale and POC and patient is in agreement  Person educated: Patient Education method: Explanation Education comprehension: verbalized understanding and needs further education  HOME EXERCISE PROGRAM: Access Code: [email or web address] URL: [email or web address] Date: 04/05/2023 Prepared by: Gustavus Bryant  Exercises - Hip Flexor Stretch at [facility] of Bed  - 2 x daily - 5 x weekly - 1 sets - 2 reps - 30s hold - Clamshell  - 2 x daily - 5 x weekly - 2 sets - 15 reps - 30s hold - Hooklying Single Knee to Chest Stretch  - 2 x daily - 5 x weekly - 1 sets - 2 reps - 30s hold  ASSESSMENT:  CLINICAL IMPRESSION: Patient is a 57 y.o. male who was seen today for physical therapy evaluation and treatment for chronic L hip pain. Patient presents with ROM deficits, L hip weakness, below functional reps on 30s chair stand test and a 54% score on FOTO.  Mild tenderness to L greater trochanter and piriformis but main limitations were with Hip ROM and mobility.  OBJECTIVE IMPAIRMENTS: Abnormal gait, decreased activity tolerance, decreased balance, decreased mobility, difficulty walking, decreased ROM, impaired perceived functional ability, impaired flexibility,  improper body mechanics, and pain.   ACTIVITY LIMITATIONS: carrying, lifting, sitting, standing, and stairs  PERSONAL FACTORS: Age, Fitness, Past/current experiences, and 1 comorbidity: CMT  are also affecting patient's functional outcome.   REHAB POTENTIAL: Good  CLINICAL DECISION MAKING: Stable/uncomplicated  EVALUATION COMPLEXITY: Low   GOALS: Goals reviewed  with patient? No    SHORT TERM GOALS=LONG TERM GOALS: Target date: 05/17/2023    Patient will score at least 66% on FOTO to signify clinically meaningful improvement in functional abilities.   Baseline: 54% Goal status: INITIAL  2.  Patient will increase 30s chair stand reps from 4 to 8 with/without arms to demonstrate and improved functional ability with less pain/difficulty as well as reduce fall risk.  Baseline: 4 Goal status: INITIAL  3.  Patient will acknowledge 4/10 pain at least once during episode of care   Baseline: 6-7/10 Goal status: INITIAL  4.  Patient to demonstrate independence in HEP  Baseline: [id number] Goal status: INITIAL  5.  Increase L hip ROM to 110d flexion and 10d extension Baseline: 90d and 0d respectively Goal status: INITIAL    PLAN:  PT FREQUENCY: 1-2x/week  PT DURATION: 6 weeks  PLANNED INTERVENTIONS: 97164- PT Re-evaluation, 97110-Therapeutic exercises, 97530- Therapeutic activity, 97112- Neuromuscular re-education, 97535- Self Care, 86578- Manual therapy, 97116- Gait training, 97014- Electrical stimulation (unattended), Dry Needling, Spinal mobilization, Cryotherapy, and Moist heat  PLAN FOR NEXT SESSION: HEP review and update, manual techniques as appropriate, aerobic tasks, ROM and flexibility activities, strengthening and PREs, TPDN, gait and balance training as needed     Hildred Laser, PT 04/05/2023, 3:57 PM

## 2023-04-05 ENCOUNTER — Ambulatory Visit: Payer: Managed Care, Other (non HMO) | Attending: Family Medicine

## 2023-04-05 ENCOUNTER — Other Ambulatory Visit: Payer: Self-pay

## 2023-04-05 DIAGNOSIS — M6281 Muscle weakness (generalized): Secondary | ICD-10-CM | POA: Insufficient documentation

## 2023-04-05 DIAGNOSIS — R2689 Other abnormalities of gait and mobility: Secondary | ICD-10-CM | POA: Diagnosis present

## 2023-04-05 DIAGNOSIS — M25552 Pain in left hip: Secondary | ICD-10-CM | POA: Insufficient documentation

## 2023-04-05 DIAGNOSIS — M7062 Trochanteric bursitis, left hip: Secondary | ICD-10-CM | POA: Insufficient documentation

## 2023-04-11 ENCOUNTER — Other Ambulatory Visit: Payer: Self-pay

## 2023-04-11 ENCOUNTER — Encounter (HOSPITAL_COMMUNITY): Payer: Self-pay

## 2023-04-11 ENCOUNTER — Emergency Department (HOSPITAL_COMMUNITY)
Admission: EM | Admit: 2023-04-11 | Discharge: 2023-04-11 | Disposition: A | Discharge status: Home / Self Care | Payer: Managed Care, Other (non HMO) | Attending: Emergency Medicine | Admitting: Emergency Medicine

## 2023-04-11 ENCOUNTER — Emergency Department (HOSPITAL_COMMUNITY): Payer: Managed Care, Other (non HMO)

## 2023-04-11 DIAGNOSIS — M25552 Pain in left hip: Secondary | ICD-10-CM | POA: Insufficient documentation

## 2023-04-11 DIAGNOSIS — Z79899 Other long term (current) drug therapy: Secondary | ICD-10-CM | POA: Insufficient documentation

## 2023-04-11 MED ORDER — HYDROCODONE-ACETAMINOPHEN 5-325 MG PO TABS: 1.0000 | ORAL_TABLET | ORAL | 0 refills | Status: DC | PRN

## 2023-04-11 MED ORDER — HYDROCODONE-ACETAMINOPHEN 5-325 MG PO TABS
1.0000 | ORAL_TABLET | Freq: Once | ORAL | Status: AC
Start: 2023-04-11 — End: 2023-04-11
  Administered 2023-04-11: 1 via ORAL
  Filled 2023-04-11: qty 1

## 2023-04-11 NOTE — Discharge Instructions (Signed)
Continue doing your rehab exercises. You can take your Mobic regularly, and use Norco if needed for severe pain.   Follow up with your doctor for recheck as needed.

## 2023-04-11 NOTE — ED Triage Notes (Signed)
Pt has chronic hip pain, today pt had difficulty getting out of bed. Endorses the pain has increased, would like it x-rayed.

## 2023-04-11 NOTE — ED Provider Notes (Signed)
[facility] EMERGENCY DEPARTMENT AT [facility] Provider Note   CSN: [id number] Arrival date & time: 04/11/23  1603     History  Chief Complaint  Patient presents with   Hip Pain    Aaron Bond is a 57 y.o. male.  Patient to ED with ongoing left hip pain for the past 5 months, seems to be worse over the last 4 weeks. He is taking Meloxicam but this is not providing any relief. He has seen his doctor, orthopedics and has been sent for rehab.    Hip Pain       Home Medications Prior to Admission medications   Medication Sig Start Date End Date Taking? Authorizing Provider  HYDROcodone-acetaminophen (NORCO/VICODIN) 5-325 MG tablet Take 1 tablet by mouth every 4 (four) hours as needed. 04/11/23  Yes Dracen Reigle, PA-C  amLODipine (NORVASC) 10 MG tablet TAKE 1 TABLET BY MOUTH EVERYDAY AT BEDTIME 11/14/22   Alfredo Martinez, MD  guaiFENesin (ROBITUSSIN) 100 MG/5ML liquid Take 5 mLs by mouth every 4 (four) hours as needed for cough or to loosen phlegm. 03/14/23   Alfredo Martinez, MD  losartan-hydrochlorothiazide (HYZAAR) 50-12.5 MG tablet TAKE 1 TABLET BY MOUTH EVERY DAY 12/30/22   Alfredo Martinez, MD  meloxicam (MOBIC) 15 MG tablet Take 1 tablet (15 mg total) by mouth daily. 03/24/23   Marisa Cyphers, MD  potassium chloride SA (KLOR-CON M) 20 MEQ tablet Take 1 tablet (20 mEq total) by mouth 2 (two) times daily for 1 day. 05/31/22 06/01/22  Alfredo Martinez, MD  sildenafil (VIAGRA) 100 MG tablet TAK1/2 TABLET BY MOUTH DAILY AS NEEDED FOR ERECTILE DYSFUNTION 02/13/23   Alfredo Martinez, MD  traZODone (DESYREL) 50 MG tablet TAKE 1/2 TO 1 TABLET BY MOUTH AT BEDTIME AS NEEDED FOR SLEEP 11/14/22   Alfredo Martinez, MD      Allergies    Patient has no known allergies.    Review of Systems   Review of Systems  Physical Exam Updated Vital Signs BP (!) 151/105   Pulse 76   Temp 98.4 F (36.9 C) (Oral)   Resp 14   Ht 6\' 2"  (1.88 m)   Wt 129.3 kg   SpO2 98%   BMI 36.59 kg/m   Physical Exam Vitals and nursing note reviewed.  Constitutional:      Appearance: Normal appearance. He is well-developed.  Cardiovascular:     Pulses: Normal pulses.  Pulmonary:     Effort: Pulmonary effort is normal.  Abdominal:     Tenderness: There is no abdominal tenderness.  Musculoskeletal:        General: Normal range of motion.     Cervical back: Normal range of motion.     Comments: Left hip moderately tender over lateral aspect. No swelling. No shortening or rotation of the left leg. Distal pulses present.   Skin:    General: Skin is warm and dry.  Neurological:     Mental Status: He is alert and oriented to person, place, and time.     ED Results / Procedures / Treatments   Labs (all labs ordered are listed, but only abnormal results are displayed) Labs Reviewed - No data to display  EKG None  Radiology No results found.  Procedures Procedures    Medications Ordered in ED Medications  HYDROcodone-acetaminophen (NORCO/VICODIN) 5-325 MG per tablet 1 tablet (1 tablet Oral Given 04/11/23 1649)    ED Course/ Medical Decision Making/ A&P Clinical Course as of 04/16/23 1758  Sat Apr 16, 2023  1757 Patient with chronic left hip pain, now undergoing PT. His xray is negative. Discussed that PT may make the hip more sore initially but is encouraged to continue in order to see results. Norco given in the ED and helped his pain. Will provide small number by prescription. [SU]    Clinical Course User Index [SU] Elpidio Anis, PA-C                                 Medical Decision Making Amount and/or Complexity of Data Reviewed Radiology: ordered.  Risk Prescription drug management.           Final Clinical Impression(s) / ED Diagnoses Final diagnoses:  Left hip pain    Rx / DC Orders ED Discharge Orders          Ordered    HYDROcodone-acetaminophen (NORCO/VICODIN) 5-325 MG tablet  Every 4 hours PRN        04/11/23 2055               Elpidio Anis, PA-C 04/16/23 1758    Tegeler, Canary Brim, MD 04/16/23 [phone]

## 2023-04-11 NOTE — ED Provider Triage Note (Signed)
Emergency Medicine Provider Triage Evaluation Note  Aaron Bond , a 57 y.o. male  was evaluated in triage.  Pt complains of Hip pain.  Review of Systems  Positive: Hip pain x 4 weeks, Negative: fall  Physical Exam  BP (!) 158/97   Pulse 80   Temp 98.4 F (36.9 C) (Oral)   Resp 16   Ht 6\' 2"  (1.88 m)   Wt 129.3 kg   SpO2 98%   BMI 36.59 kg/m  Gen:   Awake, no distress   Resp:  Normal effort  MSK:   Moves extremities without difficulty  Other:    Medical Decision Making  Medically screening exam initiated at 4:32 PM.  Appropriate orders placed.  Aaron Bond was informed that the remainder of the evaluation will be completed by another provider, this initial triage assessment does not replace that evaluation, and the importance of remaining in the ED until their evaluation is complete.  Off and on symptoms of left hip pain x 5 months, more constant and worse over 4 weeks.  PCP - ortho - rehab Now pain worse Meloxicam, aleve without relief.    Elpidio Anis, PA-C 04/11/23 0272

## 2023-04-13 ENCOUNTER — Ambulatory Visit: Payer: Managed Care, Other (non HMO) | Admitting: Student

## 2023-04-17 ENCOUNTER — Other Ambulatory Visit: Payer: Self-pay | Admitting: Student

## 2023-04-17 DIAGNOSIS — I10 Essential (primary) hypertension: Secondary | ICD-10-CM

## 2023-04-18 ENCOUNTER — Ambulatory Visit: Payer: Managed Care, Other (non HMO) | Attending: Family Medicine

## 2023-04-18 ENCOUNTER — Ambulatory Visit
Admission: RE | Admit: 2023-04-18 | Discharge: 2023-04-18 | Disposition: A | Discharge status: Home / Self Care | Payer: Managed Care, Other (non HMO) | Source: Ambulatory Visit | Attending: Family Medicine | Admitting: Family Medicine

## 2023-04-18 ENCOUNTER — Encounter: Payer: Self-pay | Admitting: Student

## 2023-04-18 ENCOUNTER — Ambulatory Visit: Payer: Managed Care, Other (non HMO) | Admitting: Student

## 2023-04-18 VITALS — BP 120/80 | HR 77 | Ht 74.0 in | Wt 288.2 lb

## 2023-04-18 DIAGNOSIS — R2689 Other abnormalities of gait and mobility: Secondary | ICD-10-CM | POA: Diagnosis present

## 2023-04-18 DIAGNOSIS — G8929 Other chronic pain: Secondary | ICD-10-CM | POA: Diagnosis not present

## 2023-04-18 DIAGNOSIS — M6281 Muscle weakness (generalized): Secondary | ICD-10-CM | POA: Diagnosis present

## 2023-04-18 DIAGNOSIS — M5440 Lumbago with sciatica, unspecified side: Secondary | ICD-10-CM

## 2023-04-18 DIAGNOSIS — Z23 Encounter for immunization: Secondary | ICD-10-CM

## 2023-04-18 DIAGNOSIS — M7062 Trochanteric bursitis, left hip: Secondary | ICD-10-CM | POA: Diagnosis present

## 2023-04-18 NOTE — Therapy (Addendum)
OUTPATIENT PHYSICAL THERAPY TREATMENT NOTE/DISCHARGE SUMMARY   Patient Name: Aaron Bond MRN: [id number] DOB:07/30/1965, 57 y.o., male Today's Date: 06/08/2023  END OF SESSION:  PHYSICAL THERAPY DISCHARGE SUMMARY  Visits from Start of Care: 2  Current functional level related to goals / functional outcomes: UTA   Remaining deficits: UTA   Education / Equipment: HEP   Patient agrees to discharge. Patient goals were not met. Patient is being discharged due to not returning since the last visit.    Past Medical History:  Diagnosis Date   Charcot-Marie-Tooth disease    Charcot-Marie-Tooth disease    CMT (Charcot-Marie-Tooth disease)    ED (erectile dysfunction)    Hypertension    Insomnia    Seasonal allergies    Past Surgical History:  Procedure Laterality Date   NO PAST SURGERIES     Patient Active Problem List   Diagnosis Date Noted   Healthcare maintenance 03/30/2021   Charcot-Marie-Tooth disease 11/18/2020   Essential hypertension, benign 05/15/2015   Low back pain 12/23/2011   Tobacco abuse 09/26/2006   OBESITY, NOS 07/07/2006   NEUROPATHY, PERIPHERAL 07/07/2006   RHINITIS, ALLERGIC 07/07/2006   Erectile dysfunction 07/07/2006   Insomnia 07/07/2006    PCP: Alfredo Martinez, MD   REFERRING PROVIDER: Nestor Ramp, MD  REFERRING DIAG: [phone] (ICD-10-CM) - Greater trochanteric pain syndrome of left lower extremity  THERAPY DIAG:  Trochanteric bursitis of left hip  Muscle weakness (generalized)  Other abnormalities of gait and mobility  Rationale for Evaluation and Treatment: Rehabilitation  ONSET DATE: 1 month lately but chronic in the past  SUBJECTIVE:   SUBJECTIVE STATEMENT: Patient reports that his pain is improving, states he saw his PCP this morning who has recommended an xray for his back.     PMHx of CMT  PERTINENT HISTORY: Patient is a 57 y.o. male here with chief complaint of left hip and low back pain. He states this has been  ongoing for roughly 3 months, though over the past 3 weeks has been functionally impairing him both at home and work. He was recently seen by his PCP for this and referred here. 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 It worsens with prolonged sitting and standing. Improves some with NSAIDs, but getting short-lived benefit. Locates pain primarily to the lateral left hip with some radiation down the lateral hip to about mid thigh. No perceived weakness and no issues with incontinence.  PAIN:  Are you having pain? Yes: NPRS scale: 6-7/10 Pain location: L hip Pain description: ache Aggravating factors: certain movements Relieving factors: injection, position changes   PRECAUTIONS: Fall  RED FLAGS: None   WEIGHT BEARING RESTRICTIONS: No  FALLS:  Has patient fallen in last 6 months? No  OCCUPATION: Clinical biochemist  PLOF: Independent  PATIENT GOALS: To manage my hip pain  NEXT MD VISIT: TBD  OBJECTIVE:  Note: Objective measures were completed at Evaluation unless otherwise noted.  DIAGNOSTIC FINDINGS: none  PATIENT SURVEYS:  FOTO 54(66 predicted)   MUSCLE LENGTH: Hamstrings: Right 70 deg; Left 70 deg Thomas test: positive L  POSTURE: No Significant postural limitations  PALPATION: Mild TTP to L greater trochanter and piriformis   LOWER EXTREMITY ROM:  Active ROM Right eval Left eval  Hip flexion  90d  Hip extension  0d  Hip abduction    Hip adduction    Hip internal rotation  Hip external rotation    Knee flexion    Knee extension    Ankle dorsiflexion    Ankle plantarflexion    Ankle inversion    Ankle eversion     (Blank rows = not tested)  LOWER EXTREMITY MMT:  MMT Right eval Left eval  Hip flexion  4-  Hip extension  4-  Hip abduction  4-  Hip adduction    Hip internal rotation    Hip external rotation    Knee  flexion    Knee extension    Ankle dorsiflexion    Ankle plantarflexion    Ankle inversion    Ankle eversion     (Blank rows = not tested)  LOWER EXTREMITY SPECIAL TESTS:  Hip special tests: Luisa Hart (FABER) test: positive , Anterior hip impingement test: negative, and no ITB tightness identified  FUNCTIONAL TESTS:  30 seconds chair stand test 4 reps  GAIT: Distance walked: 28ft x2 Assistive device utilized: None Level of assistance: Complete Independence Comments: foot slap B due to CMT   TODAY'S TREATMENT:        [facility] Adult PT Treatment:                                                DATE: 04/18/23 Therapeutic Exercise: Nustep level 5 x 5 mins while gathering subjective and planning session with patient Standing hip abduction/extension 2x10 ea BIL Standing marching single UE support 2x30" Step ups 6" Lt leading fwd/lat x10 ea Seated hamstring stretch 2x30" Lt Alternating LAQ with adduction 2x10 BIL SLR LLE 2x10 Bridges x5 (cramp in L hamstring) Hooklying clamshell BlueTB 2x15 STS 2x10 arms crossed                                                                                            DATE: 04/05/23    PATIENT EDUCATION:  Education details: Discussed eval findings, rehab rationale and POC and patient is in agreement  Person educated: Patient Education method: Explanation Education comprehension: verbalized understanding and needs further education  HOME EXERCISE PROGRAM: Access Code: [email or web address] URL: [email or web address] Date: 04/05/2023 Prepared by: Gustavus Bryant  Exercises - Hip Flexor Stretch at [facility] of Bed  - 2 x daily - 5 x weekly - 1 sets - 2 reps - 30s hold - Clamshell  - 2 x daily - 5 x weekly - 2 sets - 15 reps - 30s hold - Hooklying Single Knee to Chest Stretch  - 2 x daily - 5 x weekly - 1 sets - 2 reps - 30s hold  ASSESSMENT:  CLINICAL IMPRESSION: Patient presents to first follow up PT session reporting continued hip pain, though  lessened today, and that he has been compliant with his HEP. Session today focused on proximal hip strengthening with standing and mat based activities. Patient was able to tolerate all prescribed exercises with no adverse effects. Patient continues to benefit from skilled PT services and should be progressed as able to improve functional independence.    OBJECTIVE  IMPAIRMENTS: Abnormal gait, decreased activity tolerance, decreased balance, decreased mobility, difficulty walking, decreased ROM, impaired perceived functional ability, impaired flexibility, improper body mechanics, and pain.   ACTIVITY LIMITATIONS: carrying, lifting, sitting, standing, and stairs  PERSONAL FACTORS: Age, Fitness, Past/current experiences, and 1 comorbidity: CMT  are also affecting patient's functional outcome.   REHAB POTENTIAL: Good  CLINICAL DECISION MAKING: Stable/uncomplicated  EVALUATION COMPLEXITY: Low   GOALS: Goals reviewed with patient? No    SHORT TERM GOALS=LONG TERM GOALS: Target date: 05/17/2023    Patient will score at least 66% on FOTO to signify clinically meaningful improvement in functional abilities.   Baseline: 54% Goal status: INITIAL  2.  Patient will increase 30s chair stand reps from 4 to 8 with/without arms to demonstrate and improved functional ability with less pain/difficulty as well as reduce fall risk.  Baseline: 4 Goal status: INITIAL  3.  Patient will acknowledge 4/10 pain at least once during episode of care   Baseline: 6-7/10 Goal status: INITIAL  4.  Patient to demonstrate independence in HEP  Baseline: [id number] Goal status: INITIAL  5.  Increase L hip ROM to 110d flexion and 10d extension Baseline: 90d and 0d respectively Goal status: INITIAL    PLAN:  PT FREQUENCY: 1-2x/week  PT DURATION: 6 weeks  PLANNED INTERVENTIONS: 97164- PT Re-evaluation, 97110-Therapeutic exercises, 97530- Therapeutic activity, 97112- Neuromuscular re-education, 97535- Self  Care, 29562- Manual therapy, 97116- Gait training, 97014- Electrical stimulation (unattended), Dry Needling, Spinal mobilization, Cryotherapy, and Moist heat  PLAN FOR NEXT SESSION: HEP review and update, manual techniques as appropriate, aerobic tasks, ROM and flexibility activities, strengthening and PREs, TPDN, gait and balance training as needed     Hildred Laser, PT 06/08/2023, 1:48 PM

## 2023-04-18 NOTE — Patient Instructions (Signed)
It was great to see you today! Thank you for choosing [facility] for your primary care.  Today we addressed: We will order an xray of the lumbar spine  2 months  Continue with PT and over the counter medications   If you haven't already, sign up for My Chart to have easy access to your labs results, and communication with your primary care physician. I recommend that you always bring your medications to each appointment as this makes it easy to ensure you are on the correct medications and helps [place] not miss refills when you need them. Call the clinic at [phone] if your symptoms worsen or you have any concerns.  Please arrive 15 minutes before your appointment to ensure smooth check in process.  We appreciate your efforts in making this happen.  Thank you for allowing me to participate in your care, Alfredo Martinez, MD 04/18/2023, 11:29 AM PGY-3, [facility]

## 2023-04-18 NOTE — Progress Notes (Signed)
    SUBJECTIVE:   CHIEF COMPLAINT / HPI:   Left Hip: - Ongoing a couple of months on left, left lower back and hip, no groin pain  - Taking Ibuprofen and Tylenol routinely, helps the pain to some extent. Last time took Ibuprofen was last night   - No injury to the area   Lower back pain:  Months of pain, left lumbar region  Left leg radicular symptoms  No fevers or chills  Hard time lifting the leg intermittently No numbness  No bowel or bladder incontinence  PERTINENT  PMH / PSH:  Charcot Marie Tooth-Has orthotics  Insomnia  Difficulty sleeping: Trazodone  Erectile dysfunction   OBJECTIVE:   BP 120/80   Pulse 77   Ht 6\' 2"  (1.88 m)   Wt 288 lb 4 oz (130.7 kg)   SpO2 97%   BMI 37.01 kg/m   General: NAD, pleasant, able to participate in exam Card: RRR Respiratory: No respiratory distress Skin: warm and dry, no rashes noted Psych: Normal affect and mood Back Exam:  Inspection: Unremarkable  Palpable tenderness: None. Range of Motion:  Flexion 45 deg; Extension 45 deg Strength at foot: Dorsi-flexion: 4/5 on left Eversion: 5/5 Inversion: 5/5  Sensory change: Gross sensation intact to all lumbar and sacral dermatomes.   Gait unremarkable. SLR laying: Negative  Hip Exam: FROM, slight pain with IR and ER  ASSESSMENT/PLAN:   Assessment & Plan Chronic left-sided low back pain with sciatica, sciatica laterality unspecified - Continue with PT rx - Tylenol and Ibuprofen alternating for pain - XR L-spine given presence of osteophyte on previous hip imaging - No red flag symptoms to causes concern for neural impingement or cauda equina  - Return if symptoms persists after conservative treatment in 1-2 months        Alfredo Martinez, MD [facility]

## 2023-04-18 NOTE — Assessment & Plan Note (Signed)
-   Continue with PT rx - Tylenol and Ibuprofen alternating for pain - XR L-spine given presence of osteophyte on previous hip imaging - No red flag symptoms to causes concern for neural impingement or cauda equina  - Return if symptoms persists after conservative treatment in 1-2 months

## 2023-04-22 ENCOUNTER — Ambulatory Visit: Payer: Managed Care, Other (non HMO) | Admitting: Family Medicine

## 2023-04-26 ENCOUNTER — Ambulatory Visit: Payer: Managed Care, Other (non HMO)

## 2023-04-29 NOTE — Therapy (Deleted)
 OUTPATIENT PHYSICAL THERAPY TREATMENT NOTE   Patient Name: Aaron Bond MRN: [id number] DOB:Sep 18, 1965, 57 y.o., male Today's Date: 04/18/2023  END OF SESSION:  PT End of Session - 04/18/23 1213     Visit Number 2    Number of Visits 7    Authorization Type [employer]    PT Start Time 1215    PT Stop Time 1255    PT Time Calculation (min) 40 min    Activity Tolerance Patient tolerated treatment well    Behavior During Therapy WFL for tasks assessed/performed              Past Medical History:  Diagnosis Date   Charcot-Marie-Tooth disease    Charcot-Marie-Tooth disease    CMT (Charcot-Marie-Tooth disease)    ED (erectile dysfunction)    Hypertension    Insomnia    Seasonal allergies    Past Surgical History:  Procedure Laterality Date   NO PAST SURGERIES     Patient Active Problem List   Diagnosis Date Noted   Healthcare maintenance 03/30/2021   Charcot-Marie-Tooth disease 11/18/2020   Essential hypertension, benign 05/15/2015   Low back pain 12/23/2011   Tobacco abuse 09/26/2006   OBESITY, NOS 07/07/2006   NEUROPATHY, PERIPHERAL 07/07/2006   RHINITIS, ALLERGIC 07/07/2006   Erectile dysfunction 07/07/2006   Insomnia 07/07/2006    PCP: Alfredo Martinez, MD   REFERRING PROVIDER: Nestor Ramp, MD  REFERRING DIAG: [phone] (ICD-10-CM) - Greater trochanteric pain syndrome of left lower extremity  THERAPY DIAG:  Trochanteric bursitis of left hip  Muscle weakness (generalized)  Other abnormalities of gait and mobility  Rationale for Evaluation and Treatment: Rehabilitation  ONSET DATE: 1 month lately but chronic in the past  SUBJECTIVE:   SUBJECTIVE STATEMENT: Patient reports that his pain is improving, states he saw his PCP this morning who has recommended an xray for his back.     PMHx of CMT  PERTINENT HISTORY: Patient is a 57 y.o. male here with chief complaint of left hip and low back pain. He states this has been ongoing for roughly 3 months,  though over the past 3 weeks has been functionally impairing him both at home and work. He was recently seen by his PCP for this and referred here. 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 It worsens with prolonged sitting and standing. Improves some with NSAIDs, but getting short-lived benefit. Locates pain primarily to the lateral left hip with some radiation down the lateral hip to about mid thigh. No perceived weakness and no issues with incontinence.  PAIN:  Are you having pain? Yes: NPRS scale: 6-7/10 Pain location: L hip Pain description: ache Aggravating factors: certain movements Relieving factors: injection, position changes   PRECAUTIONS: Fall  RED FLAGS: None   WEIGHT BEARING RESTRICTIONS: No  FALLS:  Has patient fallen in last 6 months? No  OCCUPATION: Clinical biochemist  PLOF: Independent  PATIENT GOALS: To manage my hip pain  NEXT MD VISIT: TBD  OBJECTIVE:  Note: Objective measures were completed at Evaluation unless otherwise noted.  DIAGNOSTIC FINDINGS: none  PATIENT SURVEYS:  FOTO 54(66 predicted)   MUSCLE LENGTH: Hamstrings: Right 70 deg; Left 70 deg Thomas test: positive L  POSTURE: No Significant postural limitations  PALPATION: Mild TTP to L greater trochanter and piriformis   LOWER EXTREMITY ROM:  Active ROM Right eval Left eval  Hip flexion  90d  Hip extension  0d  Hip abduction    Hip adduction    Hip internal rotation    Hip external rotation    Knee flexion    Knee extension    Ankle dorsiflexion    Ankle plantarflexion    Ankle inversion    Ankle eversion     (Blank rows = not tested)  LOWER EXTREMITY MMT:  MMT Right eval Left eval  Hip flexion  4-  Hip extension  4-  Hip abduction  4-  Hip adduction    Hip internal rotation    Hip external rotation    Knee flexion    Knee extension    Ankle  dorsiflexion    Ankle plantarflexion    Ankle inversion    Ankle eversion     (Blank rows = not tested)  LOWER EXTREMITY SPECIAL TESTS:  Hip special tests: Luisa Hart (FABER) test: positive , Anterior hip impingement test: negative, and no ITB tightness identified  FUNCTIONAL TESTS:  30 seconds chair stand test 4 reps  GAIT: Distance walked: 22ft x2 Assistive device utilized: None Level of assistance: Complete Independence Comments: foot slap B due to CMT   TODAY'S TREATMENT:        [facility] Adult PT Treatment:                                                DATE: 04/18/23 Therapeutic Exercise: Nustep level 5 x 5 mins while gathering subjective and planning session with patient Standing hip abduction/extension 2x10 ea BIL Standing marching single UE support 2x30" Step ups 6" Lt leading fwd/lat x10 ea Seated hamstring stretch 2x30" Lt Alternating LAQ with adduction 2x10 BIL SLR LLE 2x10 Bridges x5 (cramp in L hamstring) Hooklying clamshell BlueTB 2x15 STS 2x10 arms crossed                                                                                            DATE: 04/05/23    PATIENT EDUCATION:  Education details: Discussed eval findings, rehab rationale and POC and patient is in agreement  Person educated: Patient Education method: Explanation Education comprehension: verbalized understanding and needs further education  HOME EXERCISE PROGRAM: Access Code: [email or web address] URL: [email or web address] Date: 04/05/2023 Prepared by: Gustavus Bryant  Exercises - Hip Flexor Stretch at [facility] of Bed  - 2 x daily - 5 x weekly - 1 sets - 2 reps - 30s hold - Clamshell  - 2 x daily - 5 x weekly - 2 sets - 15 reps - 30s hold - Hooklying Single Knee to Chest Stretch  - 2 x daily - 5 x weekly - 1 sets - 2 reps - 30s hold  ASSESSMENT:  CLINICAL IMPRESSION: Patient presents to first follow up PT session reporting continued hip pain, though lessened today, and that he has been  compliant with his HEP. Session today focused on proximal hip strengthening with standing and mat based activities. Patient was able to tolerate all prescribed exercises with no adverse  effects. Patient continues to benefit from skilled PT services and should be progressed as able to improve functional independence.    OBJECTIVE IMPAIRMENTS: Abnormal gait, decreased activity tolerance, decreased balance, decreased mobility, difficulty walking, decreased ROM, impaired perceived functional ability, impaired flexibility, improper body mechanics, and pain.   ACTIVITY LIMITATIONS: carrying, lifting, sitting, standing, and stairs  PERSONAL FACTORS: Age, Fitness, Past/current experiences, and 1 comorbidity: CMT  are also affecting patient's functional outcome.   REHAB POTENTIAL: Good  CLINICAL DECISION MAKING: Stable/uncomplicated  EVALUATION COMPLEXITY: Low   GOALS: Goals reviewed with patient? No    SHORT TERM GOALS=LONG TERM GOALS: Target date: 05/17/2023    Patient will score at least 66% on FOTO to signify clinically meaningful improvement in functional abilities.   Baseline: 54% Goal status: INITIAL  2.  Patient will increase 30s chair stand reps from 4 to 8 with/without arms to demonstrate and improved functional ability with less pain/difficulty as well as reduce fall risk.  Baseline: 4 Goal status: INITIAL  3.  Patient will acknowledge 4/10 pain at least once during episode of care   Baseline: 6-7/10 Goal status: INITIAL  4.  Patient to demonstrate independence in HEP  Baseline: [id number] Goal status: INITIAL  5.  Increase L hip ROM to 110d flexion and 10d extension Baseline: 90d and 0d respectively Goal status: INITIAL    PLAN:  PT FREQUENCY: 1-2x/week  PT DURATION: 6 weeks  PLANNED INTERVENTIONS: 97164- PT Re-evaluation, 97110-Therapeutic exercises, 97530- Therapeutic activity, 97112- Neuromuscular re-education, 97535- Self Care, 82956- Manual therapy, 97116- Gait  training, 97014- Electrical stimulation (unattended), Dry Needling, Spinal mobilization, Cryotherapy, and Moist heat  PLAN FOR NEXT SESSION: HEP review and update, manual techniques as appropriate, aerobic tasks, ROM and flexibility activities, strengthening and PREs, TPDN, gait and balance training as needed     Berta Minor, PTA 04/18/2023, 12:56 PM

## 2023-05-01 NOTE — Therapy (Deleted)
OUTPATIENT PHYSICAL THERAPY TREATMENT NOTE   Patient Name: Aaron Bond MRN: [id number] DOB:1966-05-03, 57 y.o., male Today's Date: 05/01/2023  END OF SESSION:     Past Medical History:  Diagnosis Date   Charcot-Marie-Tooth disease    Charcot-Marie-Tooth disease    CMT (Charcot-Marie-Tooth disease)    ED (erectile dysfunction)    Hypertension    Insomnia    Seasonal allergies    Past Surgical History:  Procedure Laterality Date   NO PAST SURGERIES     Patient Active Problem List   Diagnosis Date Noted   Healthcare maintenance 03/30/2021   Charcot-Marie-Tooth disease 11/18/2020   Essential hypertension, benign 05/15/2015   Low back pain 12/23/2011   Tobacco abuse 09/26/2006   OBESITY, NOS 07/07/2006   NEUROPATHY, PERIPHERAL 07/07/2006   RHINITIS, ALLERGIC 07/07/2006   Erectile dysfunction 07/07/2006   Insomnia 07/07/2006    PCP: Alfredo Martinez, MD   REFERRING PROVIDER: Nestor Ramp, MD  REFERRING DIAG: [phone] (ICD-10-CM) - Greater trochanteric pain syndrome of left lower extremity  THERAPY DIAG:  No diagnosis found.  Rationale for Evaluation and Treatment: Rehabilitation  ONSET DATE: 1 month lately but chronic in the past  SUBJECTIVE:   SUBJECTIVE STATEMENT: Patient reports that his pain is improving, states he saw his PCP this morning who has recommended an xray for his back.     PMHx of CMT  PERTINENT HISTORY: Patient is a 57 y.o. male here with chief complaint of left hip and low back pain. He states this has been ongoing for roughly 3 months, though over the past 3 weeks has been functionally impairing him both at home and work. He was recently seen by his PCP for this and referred here. 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 It worsens with prolonged sitting and standing. Improves some with NSAIDs, but getting  short-lived benefit. Locates pain primarily to the lateral left hip with some radiation down the lateral hip to about mid thigh. No perceived weakness and no issues with incontinence.  PAIN:  Are you having pain? Yes: NPRS scale: 6-7/10 Pain location: L hip Pain description: ache Aggravating factors: certain movements Relieving factors: injection, position changes   PRECAUTIONS: Fall  RED FLAGS: None   WEIGHT BEARING RESTRICTIONS: No  FALLS:  Has patient fallen in last 6 months? No  OCCUPATION: Clinical biochemist  PLOF: Independent  PATIENT GOALS: To manage my hip pain  NEXT MD VISIT: TBD  OBJECTIVE:  Note: Objective measures were completed at Evaluation unless otherwise noted.  DIAGNOSTIC FINDINGS: none  PATIENT SURVEYS:  FOTO 54(66 predicted)   MUSCLE LENGTH: Hamstrings: Right 70 deg; Left 70 deg Thomas test: positive L  POSTURE: No Significant postural limitations  PALPATION: Mild TTP to L greater trochanter and piriformis   LOWER EXTREMITY ROM:  Active ROM Right eval Left eval  Hip flexion  90d  Hip extension  0d  Hip abduction    Hip adduction    Hip internal rotation    Hip external rotation    Knee flexion    Knee extension    Ankle dorsiflexion    Ankle plantarflexion    Ankle inversion    Ankle eversion     (Blank rows = not tested)  LOWER EXTREMITY MMT:  MMT Right eval Left eval  Hip flexion  4-  Hip extension  4-  Hip abduction  4-  Hip adduction    Hip internal rotation    Hip external rotation    Knee flexion    Knee extension    Ankle dorsiflexion    Ankle plantarflexion    Ankle inversion    Ankle eversion     (Blank rows = not tested)  LOWER EXTREMITY SPECIAL TESTS:  Hip special tests: Luisa Hart (FABER) test: positive , Anterior hip impingement test: negative, and no ITB tightness identified  FUNCTIONAL TESTS:  30 seconds chair stand test 4 reps  GAIT: Distance walked: 26ft x2 Assistive device utilized: None Level of  assistance: Complete Independence Comments: foot slap B due to CMT   TODAY'S TREATMENT:        [facility] Adult PT Treatment:                                                DATE: 04/18/23 Therapeutic Exercise: Nustep level 5 x 5 mins while gathering subjective and planning session with patient Standing hip abduction/extension 2x10 ea BIL Standing marching single UE support 2x30" Step ups 6" Lt leading fwd/lat x10 ea Seated hamstring stretch 2x30" Lt Alternating LAQ with adduction 2x10 BIL SLR LLE 2x10 Bridges x5 (cramp in L hamstring) Hooklying clamshell BlueTB 2x15 STS 2x10 arms crossed                                                                                            DATE: 04/05/23    PATIENT EDUCATION:  Education details: Discussed eval findings, rehab rationale and POC and patient is in agreement  Person educated: Patient Education method: Explanation Education comprehension: verbalized understanding and needs further education  HOME EXERCISE PROGRAM: Access Code: [email or web address] URL: [email or web address] Date: 04/05/2023 Prepared by: Gustavus Bryant  Exercises - Hip Flexor Stretch at [facility] of Bed  - 2 x daily - 5 x weekly - 1 sets - 2 reps - 30s hold - Clamshell  - 2 x daily - 5 x weekly - 2 sets - 15 reps - 30s hold - Hooklying Single Knee to Chest Stretch  - 2 x daily - 5 x weekly - 1 sets - 2 reps - 30s hold  ASSESSMENT:  CLINICAL IMPRESSION: Patient presents to first follow up PT session reporting continued hip pain, though lessened today, and that he has been compliant with his HEP. Session today focused on proximal hip strengthening with standing and mat based activities. Patient was able to tolerate all prescribed exercises with no adverse effects. Patient continues to benefit from skilled PT services and should be progressed as able to improve functional independence.    OBJECTIVE IMPAIRMENTS: Abnormal gait, decreased activity tolerance, decreased  balance, decreased mobility, difficulty walking, decreased ROM, impaired perceived functional ability, impaired flexibility, improper body mechanics, and pain.   ACTIVITY LIMITATIONS: carrying, lifting, sitting, standing, and stairs  PERSONAL FACTORS: Age, Fitness, Past/current experiences, and 1 comorbidity: CMT  are also affecting patient's functional outcome.   REHAB POTENTIAL: Good  CLINICAL DECISION MAKING: Stable/uncomplicated  EVALUATION COMPLEXITY: Low  GOALS: Goals reviewed with patient? No    SHORT TERM GOALS=LONG TERM GOALS: Target date: 05/17/2023    Patient will score at least 66% on FOTO to signify clinically meaningful improvement in functional abilities.   Baseline: 54% Goal status: INITIAL  2.  Patient will increase 30s chair stand reps from 4 to 8 with/without arms to demonstrate and improved functional ability with less pain/difficulty as well as reduce fall risk.  Baseline: 4 Goal status: INITIAL  3.  Patient will acknowledge 4/10 pain at least once during episode of care   Baseline: 6-7/10 Goal status: INITIAL  4.  Patient to demonstrate independence in HEP  Baseline: [id number] Goal status: INITIAL  5.  Increase L hip ROM to 110d flexion and 10d extension Baseline: 90d and 0d respectively Goal status: INITIAL    PLAN:  PT FREQUENCY: 1-2x/week  PT DURATION: 6 weeks  PLANNED INTERVENTIONS: 97164- PT Re-evaluation, 97110-Therapeutic exercises, 97530- Therapeutic activity, 97112- Neuromuscular re-education, 97535- Self Care, 86578- Manual therapy, 97116- Gait training, 97014- Electrical stimulation (unattended), Dry Needling, Spinal mobilization, Cryotherapy, and Moist heat  PLAN FOR NEXT SESSION: HEP review and update, manual techniques as appropriate, aerobic tasks, ROM and flexibility activities, strengthening and PREs, TPDN, gait and balance training as needed     Hildred Laser, PT 05/01/2023, 10:50 AM

## 2023-05-02 ENCOUNTER — Telehealth: Payer: Self-pay

## 2023-05-02 ENCOUNTER — Ambulatory Visit: Payer: Managed Care, Other (non HMO)

## 2023-05-02 NOTE — Telephone Encounter (Signed)
Entered in error, 05/10/23 appointment changed

## 2023-05-06 ENCOUNTER — Ambulatory Visit (INDEPENDENT_AMBULATORY_CARE_PROVIDER_SITE_OTHER): Payer: Managed Care, Other (non HMO) | Admitting: Student

## 2023-05-06 ENCOUNTER — Encounter: Payer: Self-pay | Admitting: Student

## 2023-05-06 VITALS — BP 137/75 | HR 79 | Ht 74.0 in | Wt 292.4 lb

## 2023-05-06 DIAGNOSIS — M5442 Lumbago with sciatica, left side: Secondary | ICD-10-CM

## 2023-05-06 DIAGNOSIS — G8929 Other chronic pain: Secondary | ICD-10-CM | POA: Diagnosis not present

## 2023-05-06 NOTE — Assessment & Plan Note (Signed)
Assessed x-ray images in the room with patient and family member.  Discussed bridging osteophytes evident on left side of the posterior aspect.  Will obtain MRI of the L-spine for further assessment given unusual presentation on x-ray.  Additionally, have referred to neurosurgery for assistance at viewing the images.  No red flag symptoms currently.  Continue with conservative treatment until able to see neurosurgery and retrieve MRI.

## 2023-05-06 NOTE — Progress Notes (Signed)
    SUBJECTIVE:   CHIEF COMPLAINT / HPI:   X-Ray follow up:  --Patient denies any changes to his pain - Endorses continued left lateral radicular symptoms with left-sided paraspinal pain. - No bowel or bladder incontinence - No numbness  Per last note for back pain:  Months of pain, left lumbar region  Left leg radicular symptoms  No fevers or chills  Hard time lifting the leg intermittently No numbness  No bowel or bladder incontinence  PERTINENT  PMH / PSH:  Charcot Marie Tooth-Has orthotics  Insomnia  Difficulty sleeping: Trazodone  Erectile dysfunction   OBJECTIVE:   BP 137/75   Pulse 79   Ht 6\' 2"  (1.88 m)   Wt 292 lb 6.4 oz (132.6 kg)   SpO2 100%   BMI 37.54 kg/m   General: Alert and oriented in no apparent distress Heart: Regular rate and rhythm with no murmurs appreciated Lungs: CTA bilaterally, no wheezing Abdomen: Bowel sounds present, no abdominal pain Skin: Warm and dry Extremities: No lower extremity edema Back Exam:  Inspection: Unremarkable  Palpable tenderness: None. Strength: 5/5 flexion at hip  Strength at foot: Dorsi-flexion: 5/5 on left  Sensory change: Gross sensation intact to all lumbar and sacral dermatomes.   Gait unremarkable.  ASSESSMENT/PLAN:   Assessment & Plan Chronic left-sided low back pain with left-sided sciatica Assessed x-ray images in the room with patient and family member.  Discussed bridging osteophytes evident on left side of the posterior aspect.  Will obtain MRI of the L-spine for further assessment given unusual presentation on x-ray.  Additionally, have referred to neurosurgery for assistance at viewing the images.  No red flag symptoms currently.  Continue with conservative treatment until able to see neurosurgery and retrieve MRI.     Alfredo Martinez, MD [facility]

## 2023-05-06 NOTE — Patient Instructions (Addendum)
It was great to see you today! Thank you for choosing [facility] for your primary care.  Today we addressed: We have ordered an MR of the lumbar spine  Additionally, I have placed a referral to the neurosurgery physician to look at your images, they will call you to schedule appt   If you haven't already, sign up for My Chart to have easy access to your labs results, and communication with your primary care physician.  Return in about 4 weeks (around 06/03/2023). Please arrive 15 minutes before your appointment to ensure smooth check in process.  We appreciate your efforts in making this happen.  Thank you for allowing me to participate in your care, Aaron Martinez, MD 05/06/2023, 11:13 AM PGY-3, [facility]

## 2023-05-10 ENCOUNTER — Other Ambulatory Visit: Payer: Self-pay | Admitting: Student

## 2023-05-10 ENCOUNTER — Ambulatory Visit: Payer: Managed Care, Other (non HMO) | Admitting: Physical Therapy

## 2023-05-15 ENCOUNTER — Ambulatory Visit (HOSPITAL_COMMUNITY)
Admission: RE | Admit: 2023-05-15 | Discharge: 2023-05-15 | Disposition: A | Discharge status: Home / Self Care | Payer: Managed Care, Other (non HMO) | Source: Ambulatory Visit | Attending: Student | Admitting: Student

## 2023-05-15 DIAGNOSIS — G8929 Other chronic pain: Secondary | ICD-10-CM | POA: Diagnosis present

## 2023-05-15 DIAGNOSIS — M5442 Lumbago with sciatica, left side: Secondary | ICD-10-CM | POA: Insufficient documentation

## 2023-05-15 DIAGNOSIS — M419 Scoliosis, unspecified: Secondary | ICD-10-CM | POA: Insufficient documentation

## 2023-05-15 DIAGNOSIS — M2578 Osteophyte, vertebrae: Secondary | ICD-10-CM | POA: Diagnosis not present

## 2023-05-15 DIAGNOSIS — M48061 Spinal stenosis, lumbar region without neurogenic claudication: Secondary | ICD-10-CM | POA: Insufficient documentation

## 2023-05-15 DIAGNOSIS — M51369 Other intervertebral disc degeneration, lumbar region without mention of lumbar back pain or lower extremity pain: Secondary | ICD-10-CM | POA: Insufficient documentation

## 2023-05-19 ENCOUNTER — Ambulatory Visit: Payer: Managed Care, Other (non HMO) | Admitting: Family Medicine

## 2023-06-07 ENCOUNTER — Other Ambulatory Visit: Payer: Self-pay | Admitting: Sports Medicine

## 2023-06-07 ENCOUNTER — Other Ambulatory Visit: Payer: Self-pay | Admitting: Student

## 2023-06-07 DIAGNOSIS — N522 Drug-induced erectile dysfunction: Secondary | ICD-10-CM

## 2023-07-20 ENCOUNTER — Other Ambulatory Visit: Payer: Self-pay | Admitting: Student

## 2023-07-20 DIAGNOSIS — I10 Essential (primary) hypertension: Secondary | ICD-10-CM

## 2023-08-05 ENCOUNTER — Ambulatory Visit: Admitting: Family Medicine

## 2023-08-05 ENCOUNTER — Encounter: Payer: Self-pay | Admitting: Family Medicine

## 2023-08-05 VITALS — BP 141/89 | HR 76 | Ht 74.0 in | Wt 289.8 lb

## 2023-08-05 DIAGNOSIS — G600 Hereditary motor and sensory neuropathy: Secondary | ICD-10-CM

## 2023-08-05 DIAGNOSIS — Z791 Long term (current) use of non-steroidal anti-inflammatories (NSAID): Secondary | ICD-10-CM

## 2023-08-05 MED ORDER — OMEPRAZOLE 20 MG PO CPDR: 20.0000 mg | DELAYED_RELEASE_CAPSULE | Freq: Every day | ORAL | 3 refills | Status: DC

## 2023-08-05 NOTE — Assessment & Plan Note (Signed)
 Reassuringly, no clear decreased functionality in terms of foot muscle weakness. However, worsening symptomatic pain in feet. -Referral for [facility] orthopedics for orthotics, (patient declined our SM department) -Continue Tylenol/Ibuprofen for pain control -Discussed Gabapentin but will not trial at this time.

## 2023-08-05 NOTE — Patient Instructions (Addendum)
 It was great to see you! Thank you for allowing me to participate in your care!  Our plans for today:  - I have placed referral to [facility] orthopedics. They should call you in the next 2 weeks to schedule an appointment. - Please start taking Omeprazole once daily to help protect your stomach.   Please arrive 15 minutes PRIOR to your next scheduled appointment time! If you do not, this affects OTHER patients' care.  Take care and seek immediate care sooner if you develop any concerns.   Celine Mans, MD, PGY-2 [facility] 9:45 AM 08/05/2023  [facility]

## 2023-08-05 NOTE — Progress Notes (Signed)
    SUBJECTIVE:   CHIEF COMPLAINT / HPI: discuss orthotics  Here to discuss foot orthotics. Has had them all his life for Charcot-Marie-Tooth disease. Recent orthotics from [facility], not given enough support. Would like custom orthotics again.  Has had chronic worsening pain in feet because this.  Taking Tylenol and Ibuprofen for pain.  Takes 400mg  every 6 to 8 hours of ibuprofen.  No stomach pain.    PERTINENT  PMH / PSH: Charcot-Marie-Tooth disease, Peripheral neuropathy.  OBJECTIVE:   BP (!) 141/89   Pulse 76   Ht 6\' 2"  (1.88 m)   Wt 289 lb 12.8 oz (131.5 kg)   SpO2 97%   BMI 37.21 kg/m   General: NAD, well appearing Neuro: A&O Respiratory: normal WOB on RA Extremities: Moving all 4 extremities equally Feet: Loss of horizontal arch bilaterally, mild bilateral pronation, weakness with dorsiflexion bilaterally  ASSESSMENT/PLAN:   Assessment & Plan Charcot-Marie-Tooth disease Reassuringly, no clear decreased functionality in terms of foot muscle weakness. However, worsening symptomatic pain in feet. -Referral for [facility] orthopedics for orthotics, (patient declined our SM department) -Continue Tylenol/Ibuprofen for pain control -Discussed Gabapentin but will not trial at this time. NSAID long-term use Start gastric ulcer protection. -Omeprazole 20mg  daily  Return in about 3 months (around 11/05/2023).  Celine Mans, MD [facility]

## 2023-10-05 ENCOUNTER — Other Ambulatory Visit: Payer: Self-pay | Admitting: Student

## 2023-10-05 DIAGNOSIS — N522 Drug-induced erectile dysfunction: Secondary | ICD-10-CM

## 2023-10-18 ENCOUNTER — Encounter: Payer: Self-pay | Admitting: *Deleted

## 2023-10-20 ENCOUNTER — Other Ambulatory Visit: Payer: Self-pay | Admitting: Student

## 2023-10-20 DIAGNOSIS — I10 Essential (primary) hypertension: Secondary | ICD-10-CM

## 2023-11-03 ENCOUNTER — Other Ambulatory Visit: Payer: Self-pay | Admitting: Family Medicine

## 2023-11-03 DIAGNOSIS — Z791 Long term (current) use of non-steroidal anti-inflammatories (NSAID): Secondary | ICD-10-CM

## 2023-12-08 ENCOUNTER — Other Ambulatory Visit: Payer: Self-pay

## 2023-12-09 MED ORDER — TRAZODONE HCL 50 MG PO TABS: 25.0000 mg | ORAL_TABLET | Freq: Every evening | ORAL | 1 refills | Status: AC | PRN

## 2024-02-06 ENCOUNTER — Other Ambulatory Visit: Payer: Self-pay | Admitting: Family Medicine

## 2024-05-07 ENCOUNTER — Other Ambulatory Visit: Payer: Self-pay

## 2024-05-07 DIAGNOSIS — I10 Essential (primary) hypertension: Secondary | ICD-10-CM

## 2024-05-07 MED ORDER — LOSARTAN POTASSIUM-HCTZ 50-12.5 MG PO TABS: 1.0000 | ORAL_TABLET | Freq: Every day | ORAL | 0 refills | Status: AC
